# Patient Record
Sex: Female | Born: 1975 | Race: White | Hispanic: No | Marital: Single | Smoking: Never smoker
Health system: Southern US, Community
[De-identification: ages and names within clinical notes are randomized; demographics above are authoritative.]

## PROBLEM LIST (undated history)

## (undated) DIAGNOSIS — F419 Anxiety disorder, unspecified: Secondary | ICD-10-CM

## (undated) DIAGNOSIS — I82409 Acute embolism and thrombosis of unspecified deep veins of unspecified lower extremity: Secondary | ICD-10-CM

## (undated) DIAGNOSIS — I10 Essential (primary) hypertension: Secondary | ICD-10-CM

## (undated) DIAGNOSIS — N390 Urinary tract infection, site not specified: Secondary | ICD-10-CM

## (undated) DIAGNOSIS — F32A Depression, unspecified: Secondary | ICD-10-CM

## (undated) DIAGNOSIS — F329 Major depressive disorder, single episode, unspecified: Secondary | ICD-10-CM

## (undated) HISTORY — PX: FOOT SURGERY: SHX648

## (undated) HISTORY — PX: TUBAL LIGATION: SHX77

## (undated) HISTORY — PX: KIDNEY SURGERY: SHX687

## (undated) HISTORY — PX: TONSILLECTOMY: SUR1361

---

## 2004-08-14 ENCOUNTER — Ambulatory Visit: Payer: Self-pay | Admitting: Family Medicine

## 2004-08-28 ENCOUNTER — Inpatient Hospital Stay: Payer: Self-pay | Admitting: Otolaryngology

## 2004-09-22 ENCOUNTER — Ambulatory Visit: Payer: Self-pay | Admitting: Otolaryngology

## 2004-09-30 ENCOUNTER — Ambulatory Visit: Payer: Self-pay | Admitting: Otolaryngology

## 2004-09-30 ENCOUNTER — Emergency Department: Payer: Self-pay | Admitting: Emergency Medicine

## 2004-10-26 ENCOUNTER — Emergency Department: Payer: Self-pay | Admitting: Emergency Medicine

## 2004-12-11 ENCOUNTER — Ambulatory Visit: Payer: Self-pay | Admitting: Gastroenterology

## 2005-06-25 ENCOUNTER — Emergency Department: Payer: Self-pay | Admitting: Emergency Medicine

## 2005-07-25 ENCOUNTER — Emergency Department: Payer: Self-pay | Admitting: Emergency Medicine

## 2005-08-25 ENCOUNTER — Emergency Department: Payer: Self-pay | Admitting: Emergency Medicine

## 2005-08-29 ENCOUNTER — Emergency Department: Payer: Self-pay | Admitting: Emergency Medicine

## 2005-11-12 ENCOUNTER — Emergency Department: Payer: Self-pay | Admitting: General Practice

## 2005-11-12 ENCOUNTER — Ambulatory Visit: Payer: Self-pay | Admitting: Family Medicine

## 2005-12-05 ENCOUNTER — Emergency Department: Payer: Self-pay | Admitting: Emergency Medicine

## 2006-03-18 ENCOUNTER — Emergency Department: Payer: Self-pay | Admitting: Emergency Medicine

## 2006-04-16 ENCOUNTER — Emergency Department: Payer: Self-pay | Admitting: Emergency Medicine

## 2006-10-27 ENCOUNTER — Emergency Department (HOSPITAL_COMMUNITY): Admission: EM | Admit: 2006-10-27 | Discharge: 2006-10-27 | Payer: Self-pay | Admitting: Emergency Medicine

## 2007-07-30 ENCOUNTER — Emergency Department: Payer: Self-pay | Admitting: Emergency Medicine

## 2007-10-17 ENCOUNTER — Other Ambulatory Visit: Payer: Self-pay

## 2007-10-17 ENCOUNTER — Emergency Department: Payer: Self-pay

## 2008-01-06 ENCOUNTER — Ambulatory Visit: Payer: Self-pay | Admitting: Family Medicine

## 2008-01-08 ENCOUNTER — Ambulatory Visit: Payer: Self-pay | Admitting: Family Medicine

## 2008-03-09 ENCOUNTER — Emergency Department: Payer: Self-pay | Admitting: Emergency Medicine

## 2008-04-24 ENCOUNTER — Ambulatory Visit: Payer: Self-pay | Admitting: Family Medicine

## 2008-07-10 ENCOUNTER — Ambulatory Visit: Payer: Self-pay | Admitting: Family Medicine

## 2008-09-15 ENCOUNTER — Ambulatory Visit: Payer: Self-pay | Admitting: Family Medicine

## 2008-09-22 ENCOUNTER — Emergency Department: Payer: Self-pay | Admitting: Emergency Medicine

## 2008-11-06 ENCOUNTER — Ambulatory Visit: Payer: Self-pay | Admitting: Urology

## 2008-11-19 ENCOUNTER — Emergency Department: Payer: Self-pay | Admitting: Emergency Medicine

## 2008-12-02 ENCOUNTER — Ambulatory Visit: Payer: Self-pay | Admitting: Urology

## 2009-01-14 ENCOUNTER — Ambulatory Visit: Payer: Self-pay | Admitting: Family Medicine

## 2009-06-13 ENCOUNTER — Inpatient Hospital Stay: Payer: Self-pay | Admitting: Specialist

## 2009-06-29 ENCOUNTER — Emergency Department: Payer: Self-pay | Admitting: Emergency Medicine

## 2010-09-18 ENCOUNTER — Emergency Department: Payer: Self-pay | Admitting: Unknown Physician Specialty

## 2011-01-12 DIAGNOSIS — IMO0002 Reserved for concepts with insufficient information to code with codable children: Secondary | ICD-10-CM | POA: Insufficient documentation

## 2012-08-08 DIAGNOSIS — Q8501 Neurofibromatosis, type 1: Secondary | ICD-10-CM | POA: Insufficient documentation

## 2012-08-25 ENCOUNTER — Ambulatory Visit: Payer: Self-pay

## 2012-09-08 ENCOUNTER — Ambulatory Visit: Payer: Self-pay | Admitting: Family

## 2012-10-19 DIAGNOSIS — I10 Essential (primary) hypertension: Secondary | ICD-10-CM | POA: Insufficient documentation

## 2012-11-17 ENCOUNTER — Ambulatory Visit: Payer: Self-pay | Admitting: Family Medicine

## 2012-12-26 DIAGNOSIS — G4733 Obstructive sleep apnea (adult) (pediatric): Secondary | ICD-10-CM | POA: Insufficient documentation

## 2013-04-06 ENCOUNTER — Emergency Department: Payer: Self-pay | Admitting: Emergency Medicine

## 2013-04-06 LAB — CBC
MCV: 81 fL (ref 80–100)
RDW: 15.2 % — ABNORMAL HIGH (ref 11.5–14.5)

## 2013-04-06 LAB — URINALYSIS, COMPLETE
Glucose,UR: NEGATIVE mg/dL (ref 0–75)
Ketone: NEGATIVE
Ph: 8 (ref 4.5–8.0)
Squamous Epithelial: 2
WBC UR: 1 /HPF (ref 0–5)

## 2013-04-06 LAB — BASIC METABOLIC PANEL
Anion Gap: 4 — ABNORMAL LOW (ref 7–16)
BUN: 11 mg/dL (ref 7–18)
Chloride: 103 mmol/L (ref 98–107)
EGFR (African American): >60
EGFR (Non-African Amer.): >60
Glucose: 108 mg/dL — ABNORMAL HIGH (ref 65–99)
Potassium: 3.2 mmol/L — ABNORMAL LOW (ref 3.5–5.1)

## 2013-05-08 DIAGNOSIS — K921 Melena: Secondary | ICD-10-CM | POA: Insufficient documentation

## 2013-09-25 DIAGNOSIS — N946 Dysmenorrhea, unspecified: Secondary | ICD-10-CM | POA: Insufficient documentation

## 2013-09-25 DIAGNOSIS — K5900 Constipation, unspecified: Secondary | ICD-10-CM | POA: Insufficient documentation

## 2013-09-25 DIAGNOSIS — N920 Excessive and frequent menstruation with regular cycle: Secondary | ICD-10-CM | POA: Insufficient documentation

## 2013-09-25 DIAGNOSIS — D649 Anemia, unspecified: Secondary | ICD-10-CM | POA: Insufficient documentation

## 2013-11-12 DIAGNOSIS — M79601 Pain in right arm: Secondary | ICD-10-CM | POA: Insufficient documentation

## 2014-05-19 ENCOUNTER — Emergency Department: Payer: Self-pay | Admitting: Emergency Medicine

## 2015-04-12 ENCOUNTER — Emergency Department
Admission: EM | Admit: 2015-04-12 | Discharge: 2015-04-12 | Disposition: A | Discharge status: Home / Self Care | Payer: Self-pay | Attending: Emergency Medicine | Admitting: Emergency Medicine

## 2015-04-12 ENCOUNTER — Emergency Department: Payer: Self-pay

## 2015-04-12 DIAGNOSIS — J029 Acute pharyngitis, unspecified: Secondary | ICD-10-CM | POA: Insufficient documentation

## 2015-04-12 DIAGNOSIS — R319 Hematuria, unspecified: Secondary | ICD-10-CM

## 2015-04-12 DIAGNOSIS — R5383 Other fatigue: Secondary | ICD-10-CM | POA: Insufficient documentation

## 2015-04-12 DIAGNOSIS — R197 Diarrhea, unspecified: Secondary | ICD-10-CM

## 2015-04-12 DIAGNOSIS — G35 Multiple sclerosis: Secondary | ICD-10-CM | POA: Insufficient documentation

## 2015-04-12 DIAGNOSIS — N390 Urinary tract infection, site not specified: Secondary | ICD-10-CM

## 2015-04-12 DIAGNOSIS — R1110 Vomiting, unspecified: Secondary | ICD-10-CM

## 2015-04-12 HISTORY — DX: Essential (primary) hypertension: I10

## 2015-04-12 HISTORY — DX: Urinary tract infection, site not specified: N39.0

## 2015-04-12 LAB — COMPREHENSIVE METABOLIC PANEL
ALBUMIN: 4.2 g/dL (ref 3.5–5.0)
ALT: 14 U/L (ref 14–54)
AST: 21 U/L (ref 15–41)
Alkaline Phosphatase: 78 U/L (ref 38–126)
Anion gap: 7 (ref 5–15)
BILIRUBIN TOTAL: 0.9 mg/dL (ref 0.3–1.2)
BUN: 20 mg/dL (ref 6–20)
CALCIUM: 8.6 mg/dL — AB (ref 8.9–10.3)
CO2: 26 mmol/L (ref 22–32)
Chloride: 107 mmol/L (ref 101–111)
Creatinine, Ser: 0.58 mg/dL (ref 0.44–1.00)
GFR calc Af Amer: >60 mL/min (ref >60)
GLUCOSE: 88 mg/dL (ref 65–99)
Potassium: 3.9 mmol/L (ref 3.5–5.1)
SODIUM: 140 mmol/L (ref 135–145)
Total Protein: 7.5 g/dL (ref 6.5–8.1)

## 2015-04-12 LAB — CBC WITH DIFFERENTIAL/PLATELET
Basophils Absolute: 0 10*3/uL (ref 0–0.1)
Basophils Relative: 1 %
EOS ABS: 0.1 10*3/uL (ref 0–0.7)
EOS PCT: 2 %
HEMATOCRIT: 45.4 % (ref 35.0–47.0)
HEMOGLOBIN: 15.2 g/dL (ref 12.0–16.0)
LYMPHS ABS: 1.5 10*3/uL (ref 1.0–3.6)
LYMPHS PCT: 25 %
MCH: 28.6 pg (ref 26.0–34.0)
MCHC: 33.5 g/dL (ref 32.0–36.0)
MCV: 85.6 fL (ref 80.0–100.0)
MONOS PCT: 11 %
Monocytes Absolute: 0.7 10*3/uL (ref 0.2–0.9)
NEUTROS ABS: 3.8 10*3/uL (ref 1.4–6.5)
Neutrophils Relative %: 61 %
PLATELETS: 184 10*3/uL (ref 150–440)
RBC: 5.31 MIL/uL — AB (ref 3.80–5.20)
RDW: 14 % (ref 11.5–14.5)
WBC: 6.1 10*3/uL (ref 3.6–11.0)

## 2015-04-12 LAB — URINALYSIS COMPLETE WITH MICROSCOPIC (ARMC ONLY)
BILIRUBIN URINE: NEGATIVE
GLUCOSE, UA: NEGATIVE mg/dL
KETONES UR: NEGATIVE mg/dL
LEUKOCYTES UA: NEGATIVE
NITRITE: NEGATIVE
Protein, ur: 100 mg/dL — AB
SPECIFIC GRAVITY, URINE: 1.023 (ref 1.005–1.030)
pH: 5 (ref 5.0–8.0)

## 2015-04-12 LAB — LIPASE, BLOOD: Lipase: 20 U/L — ABNORMAL LOW (ref 22–51)

## 2015-04-12 MED ORDER — DIPHENHYDRAMINE HCL 50 MG/ML IJ SOLN
INTRAMUSCULAR | Status: AC
Start: 2015-04-12 — End: 2015-04-12
  Administered 2015-04-12: 25 mg via INTRAVENOUS
  Filled 2015-04-12: qty 1

## 2015-04-12 MED ORDER — MORPHINE SULFATE 4 MG/ML IJ SOLN
4.0000 mg | Freq: Once | INTRAMUSCULAR | Status: AC
  Administered 2015-04-12: 4 mg via INTRAVENOUS

## 2015-04-12 MED ORDER — DIPHENHYDRAMINE HCL 50 MG/ML IJ SOLN
25.0000 mg | Freq: Once | INTRAMUSCULAR | Status: AC
  Administered 2015-04-12: 25 mg via INTRAVENOUS

## 2015-04-12 MED ORDER — CEFPODOXIME PROXETIL 200 MG PO TABS: 200.0000 mg | ORAL_TABLET | Freq: Two times a day (BID) | ORAL | Status: DC

## 2015-04-12 MED ORDER — ONDANSETRON HCL 4 MG/2ML IJ SOLN
INTRAMUSCULAR | Status: AC
  Administered 2015-04-12: 4 mg via INTRAVENOUS
  Filled 2015-04-12: qty 2

## 2015-04-12 MED ORDER — ONDANSETRON HCL 4 MG/2ML IJ SOLN
4.0000 mg | Freq: Once | INTRAMUSCULAR | Status: AC
  Administered 2015-04-12: 4 mg via INTRAVENOUS

## 2015-04-12 MED ORDER — SODIUM CHLORIDE 0.9 % IV BOLUS (SEPSIS)
1000.0000 mL | Freq: Once | INTRAVENOUS | Status: AC
  Administered 2015-04-12: 1000 mL via INTRAVENOUS

## 2015-04-12 MED ORDER — IOHEXOL 300 MG/ML  SOLN
100.0000 mL | Freq: Once | INTRAMUSCULAR | Status: AC | PRN
  Administered 2015-04-12: 100 mL via INTRAVENOUS

## 2015-04-12 MED ORDER — HYDROCODONE-ACETAMINOPHEN 5-325 MG PO TABS: 1.0000 | ORAL_TABLET | Freq: Four times a day (QID) | ORAL | Status: DC | PRN

## 2015-04-12 MED ORDER — MORPHINE SULFATE 4 MG/ML IJ SOLN
INTRAMUSCULAR | Status: AC
  Administered 2015-04-12: 4 mg via INTRAVENOUS
  Filled 2015-04-12: qty 1

## 2015-04-12 MED ORDER — ONDANSETRON HCL 4 MG PO TABS: 4.0000 mg | ORAL_TABLET | Freq: Three times a day (TID) | ORAL | Status: DC | PRN

## 2015-04-12 MED ORDER — BARIUM SULFATE 2.1 % PO SUSP
450.0000 mL | ORAL | Status: AC
  Administered 2015-04-12: 450 mL via ORAL

## 2015-04-12 NOTE — ED Notes (Signed)
D/c instructions reviewed w/ pt - pt denies any further questions or concerns at present.  Pt instructed to not use alcohol, drive, or operate heavy machinery while take the prescription pain medications as they could make him drowsy - pt verbalized understanding.

## 2015-04-12 NOTE — ED Provider Notes (Signed)
[facility] Emergency Department Provider Note   ____________________________________________  Time seen: 10:45 AM I have reviewed the triage vital signs and the triage nursing note.  HISTORY  Chief Complaint Abdominal Pain; Emesis; and Diarrhea   Historian Patient  HPI Tiffany Andrade is a 39 y.o. female who is complaining of upper abdominal pain on the left side, along with vomiting and diarrhea for about 4 days. Abdominal pain is crampy. No diarrhea today. No black or bloody stools. She has a history of numerous urinary tract infections in the past.No specific dysuria today. She has a history of sulfa allergy which is described as "sulfa doesn't work ". Her penicillin allergy is hives. Her allergy to IVP dye was felt nauseated and "turned white "when she first had a contrasted CT scan, however since then she's had several contrasted CT scans per the patient where she had no reaction but did do 25 mg Benadryl before the CT scan.    Past Medical History  Diagnosis Date  . Hypertension   . UTI (lower urinary tract infection)     There are no active problems to display for this patient.   Past Surgical History  Procedure Laterality Date  . Tonsillectomy      Current Outpatient Rx  Name  Route  Sig  Dispense  Refill  . cefpodoxime (VANTIN) 200 MG tablet   Oral   Take 1 tablet (200 mg total) by mouth 2 (two) times daily.   14 tablet   0   . HYDROcodone-acetaminophen (NORCO/VICODIN) 5-325 MG per tablet   Oral   Take 1 tablet by mouth every 6 (six) hours as needed for moderate pain.   10 tablet   0   . ondansetron (ZOFRAN) 4 MG tablet   Oral   Take 1 tablet (4 mg total) by mouth every 8 (eight) hours as needed for nausea or vomiting.   10 tablet   1     Allergies Codeine; Ivp dye; Penicillins; Sulfa antibiotics; and Tape  No family history on file.  Social History History  Substance Use Topics  . Smoking status: Never Smoker   .  Smokeless tobacco: Not on file  . Alcohol Use: No    Review of Systems  Constitutional: Negative for fever. Eyes: Negative for visual changes. ENT: Negative for sore throat. Cardiovascular: Negative for chest pain. Respiratory: Negative for shortness of breath. Gastrointestinal: Negative for black or bloody stools. Genitourinary: Negative for dysuria. Musculoskeletal: Negative for back pain. Skin: Negative for rash. Neurological: Negative for headaches, focal weakness or numbness.  ____________________________________________   PHYSICAL EXAM:  VITAL SIGNS: ED Triage Vitals  Enc Vitals Group     BP 04/12/15 0923 154/100 mmHg     Pulse Rate 04/12/15 0923 76     Resp 04/12/15 0923 20     Temp 04/12/15 0923 97.9 F (36.6 C)     Temp Source 04/12/15 0923 Oral     SpO2 04/12/15 0923 100 %     Weight 04/12/15 0923 194 lb (87.998 kg)     Height 04/12/15 0923 4\' 11"  (1.499 m)     Head Cir --      Peak Flow --      Pain Score 04/12/15 0923 8     Pain Loc --      Pain Edu? --      Excl. in [place]? --      Constitutional: Alert and oriented. Well appearing and in no distress. Eyes: Conjunctivae are  normal. PERRL. Normal extraocular movements. ENT   Head: Normocephalic and atraumatic.   Nose: No congestion/rhinnorhea.   Mouth/Throat: Mucous membranes are moist.   Neck: No stridor. Cardiovascular: Normal rate, regular rhythm.  No murmurs, rubs, or gallops. Respiratory: Normal respiratory effort without tachypnea nor retractions. Breath sounds are clear and equal bilaterally. No wheezes/rales/rhonchi. Gastrointestinal: Soft. No distention, no guarding, no rebound. Moderate tenderness in the left upper quadrant  Genitourinary/rectal: Deferred Musculoskeletal: Nontender with normal range of motion in all extremities. No joint effusions.  No lower extremity tenderness nor edema. Neurologic:  Normal speech and language. No gross focal neurologic deficits are  appreciated. Skin:  Skin is warm, dry and intact. No rash noted. Psychiatric: Mood and affect are normal. Speech and behavior are normal. Patient exhibits appropriate insight and judgment.  ____________________________________________   EKG  None ____________________________________________  LABS (pertinent positives/negatives)  Lipase 20 Metabolic panel within normal limits White blood cell count normal at 6.1 with a normal hemoglobin 15.2 Urinalysis 3+ hemoglobin, 2 numerous to count red blood cells, 6-30 white blood cells, few bacteria with 0-5 squamous epithelial cells  ____________________________________________  RADIOLOGY Radiologist results reviewed  CT abdomen and pelvis with contrast:  IMPRESSION: Stable extrarenal pelves.  Changes at the ureterovesical junction on the left with mild dilatation of the collecting system which may be postoperative in nature given the patient's clinical history. The overall appearance is stable from the prior exam.  No acute abnormality is noted. __________________________________________  PROCEDURES  Procedure(s) performed: None Critical Care performed: None  ____________________________________________   ED COURSE / ASSESSMENT AND PLAN  Pertinent labs & imaging results that were available during my care of the patient were reviewed by me and considered in my medical decision making (see chart for details).  Patient having vomiting and diarrhea as well as labs showing a urinary tract infection with hematuria. I discussed with her obtaining a CT scan for further evaluation. Her urinalysis shows blood as well as bacteria and white blood cells but without nitrates and leukocytes. I am suspicious of possible urinary tract infection given her history of numerous urinary tract infections in the past. I will give her dose of Rocephin here and outpatient Vantin due to history of allergy sulfa, local resistance patterns of Cipro. Urine  culture was sent. Her symptoms feel different than previous kidney stones. However this is a consideration. I discussed with her contrast versus noncontrast CT knowing that for pyelonephritis contrast would be better and helpful. She states that she had an adverse reaction to contrast dye at one point time but since then has had no problems with contrast CTs and has had Benadryl prior to the CDs. We decided to go ahead with Benadryl and then contrast abdominal CT today.  CT scan is reassuring, patient will be discharged home with enema next, and symptomatic medications. Return depressions and discharge instructions along with follow-up plan discussed with patient. She is comfortable with this plan.   ___________________________________________   FINAL CLINICAL IMPRESSION(S) / ED DIAGNOSES   Final diagnoses:  Vomiting and diarrhea  Urinary tract infection with hematuria, site unspecified      Lisa Roca, MD 04/12/15 1431

## 2015-04-12 NOTE — ED Provider Notes (Signed)
-----------------------------------------   4:18 PM on 04/12/2015 -----------------------------------------  The pharmacist at [facility] called and said that they do not have Cefpodoxime in stock.  I reviewed Dr. Alinda Money note and verbally authorized a prescription for Keflex 500 mg by mouth twice a day 7 days in accordance with the [facility] infectious disease recommendations for uncomplicated UTI given her reassuring labs, stable vitals, and no CT evidence of pyelonephritis.  Hinda Kehr, MD 04/12/15 [phone]

## 2015-04-12 NOTE — Discharge Instructions (Signed)
Diarrhea Diarrhea is watery poop (stool). It can make you feel weak, tired, thirsty, or give you a dry mouth (signs of dehydration). Watery poop is a sign of another problem, most often an infection. It often lasts 2-3 days. It can last longer if it is a sign of something serious. Take care of yourself as told by your doctor. HOME CARE   Drink 1 cup (8 ounces) of fluid each time you have watery poop.  Do not drink the following fluids:  Those that contain simple sugars (fructose, glucose, galactose, lactose, sucrose, maltose).  Sports drinks.  Fruit juices.  Whole milk products.  Sodas.  Drinks with caffeine (coffee, tea, soda) or alcohol.  Oral rehydration solution may be used if the doctor says it is okay. You may make your own solution. Follow this recipe:   - teaspoon table salt.   teaspoon baking soda.   teaspoon salt substitute containing potassium chloride.  1 tablespoons sugar.  1 liter (34 ounces) of water.  Avoid the following foods:  High fiber foods, such as raw fruits and vegetables.  Nuts, seeds, and whole grain breads and cereals.   Those that are sweetened with sugar alcohols (xylitol, sorbitol, mannitol).  Try eating the following foods:  Starchy foods, such as rice, toast, pasta, low-sugar cereal, oatmeal, baked potatoes, crackers, and bagels.  Bananas.  Applesauce.  Eat probiotic-rich foods, such as yogurt and milk products that are fermented.  Wash your hands well after each time you have watery poop.  Only take medicine as told by your doctor.  Take a warm bath to help lessen burning or pain from having watery poop. GET HELP RIGHT AWAY IF:   You cannot drink fluids without throwing up (vomiting).  You keep throwing up.  You have blood in your poop, or your poop looks black and tarry.  You do not pee (urinate) in 6-8 hours, or there is only a small amount of very dark pee.  You have belly (abdominal) pain that gets worse or stays  in the same spot (localizes).  You are weak, dizzy, confused, or light-headed.  You have a very bad headache.  Your watery poop gets worse or does not get better.  You have a fever or lasting symptoms for more than 2-3 days.  You have a fever and your symptoms suddenly get worse. MAKE SURE YOU:   Understand these instructions.  Will watch your condition.  Will get help right away if you are not doing well or get worse. Document Released: 04/05/2008 Document Revised: 03/04/2014 Document Reviewed: 06/25/2012 [facility] Patient Information 2015 [place], [place]. This information is not intended to replace advice given to you by your health care provider. Make sure you discuss any questions you have with your health care provider.  Nausea and Vomiting Nausea means you feel sick to your stomach. Throwing up (vomiting) is a reflex where stomach contents come out of your mouth. HOME CARE   Take medicine as told by your doctor.  Do not force yourself to eat. However, you do need to drink fluids.  If you feel like eating, eat a normal diet as told by your doctor.  Eat rice, wheat, potatoes, bread, lean meats, yogurt, fruits, and vegetables.  Avoid high-fat foods.  Drink enough fluids to keep your pee (urine) clear or pale yellow.  Ask your doctor how to replace body fluid losses (rehydrate). Signs of body fluid loss (dehydration) include:  Feeling very thirsty.  Dry lips and mouth.  Feeling dizzy.  Dark pee.  Peeing less than normal.  Feeling confused.  Fast breathing or heart rate. GET HELP RIGHT AWAY IF:   You have blood in your throw up.  You have black or bloody poop (stool).  You have a bad headache or stiff neck.  You feel confused.  You have bad belly (abdominal) pain.  You have chest pain or trouble breathing.  You do not pee at least once every 8 hours.  You have cold, clammy skin.  You keep throwing up after 24 to 48 hours.  You have a  fever. MAKE SURE YOU:   Understand these instructions.  Will watch your condition.  Will get help right away if you are not doing well or get worse. Document Released: 04/05/2008 Document Revised: 01/10/2012 Document Reviewed: 03/19/2011 [facility] Patient Information 2015 [place], [place]. This information is not intended to replace advice given to you by your health care provider. Make sure you discuss any questions you have with your health care provider.  Urinary Tract Infection A urinary tract infection (UTI) can occur any place along the urinary tract. The tract includes the kidneys, ureters, bladder, and urethra. A type of germ called bacteria often causes a UTI. UTIs are often helped with antibiotic medicine.  HOME CARE   If given, take antibiotics as told by your doctor. Finish them even if you start to feel better.  Drink enough fluids to keep your pee (urine) clear or pale yellow.  Avoid tea, drinks with caffeine, and bubbly (carbonated) drinks.  Pee often. Avoid holding your pee in for a long time.  Pee before and after having sex (intercourse).  Wipe from front to back after you poop (bowel movement) if you are a woman. Use each tissue only once. GET HELP RIGHT AWAY IF:   You have back pain.  You have lower belly (abdominal) pain.  You have chills.  You feel sick to your stomach (nauseous).  You throw up (vomit).  Your burning or discomfort with peeing does not go away.  You have a fever.  Your symptoms are not better in 3 days. MAKE SURE YOU:   Understand these instructions.  Will watch your condition.  Will get help right away if you are not doing well or get worse. Document Released: 04/05/2008 Document Revised: 07/12/2012 Document Reviewed: 05/18/2012 [facility] Patient Information 2015 [place], [place]. This information is not intended to replace advice given to you by your health care provider. Make sure you discuss any questions you have with your  health care provider.

## 2015-04-12 NOTE — ED Notes (Signed)
Abdominal cramping X 4 days, vomiting and diarrhea after eating. Vomit X 2 today, denies diarrhea. Pt alert and oriented X4, active, cooperative, pt in NAD. RR even and unlabored, color WNL.

## 2015-04-13 LAB — URINE CULTURE

## 2015-08-30 ENCOUNTER — Emergency Department
Admission: EM | Admit: 2015-08-30 | Discharge: 2015-08-30 | Disposition: A | Discharge status: Home / Self Care | Payer: Self-pay | Attending: Emergency Medicine | Admitting: Emergency Medicine

## 2015-08-30 ENCOUNTER — Encounter: Payer: Self-pay | Admitting: Emergency Medicine

## 2015-08-30 ENCOUNTER — Emergency Department: Payer: Self-pay

## 2015-08-30 DIAGNOSIS — I10 Essential (primary) hypertension: Secondary | ICD-10-CM | POA: Insufficient documentation

## 2015-08-30 DIAGNOSIS — Z792 Long term (current) use of antibiotics: Secondary | ICD-10-CM | POA: Insufficient documentation

## 2015-08-30 DIAGNOSIS — M79604 Pain in right leg: Secondary | ICD-10-CM | POA: Insufficient documentation

## 2015-08-30 DIAGNOSIS — Z880 Allergy status to penicillin: Secondary | ICD-10-CM | POA: Insufficient documentation

## 2015-08-30 DIAGNOSIS — Z79899 Other long term (current) drug therapy: Secondary | ICD-10-CM | POA: Insufficient documentation

## 2015-08-30 DIAGNOSIS — F419 Anxiety disorder, unspecified: Secondary | ICD-10-CM | POA: Insufficient documentation

## 2015-08-30 HISTORY — DX: Acute embolism and thrombosis of unspecified deep veins of unspecified lower extremity: I82.409

## 2015-08-30 LAB — APTT: APTT: 30 s (ref 24–36)

## 2015-08-30 LAB — BASIC METABOLIC PANEL
ANION GAP: 8 (ref 5–15)
BUN: 17 mg/dL (ref 6–20)
CALCIUM: 9.2 mg/dL (ref 8.9–10.3)
CHLORIDE: 106 mmol/L (ref 101–111)
CO2: 24 mmol/L (ref 22–32)
Creatinine, Ser: 0.71 mg/dL (ref 0.44–1.00)
GFR calc non Af Amer: >60 mL/min (ref >60)
Glucose, Bld: 117 mg/dL — ABNORMAL HIGH (ref 65–99)
Potassium: 3.1 mmol/L — ABNORMAL LOW (ref 3.5–5.1)
SODIUM: 138 mmol/L (ref 135–145)

## 2015-08-30 LAB — CBC
HCT: 45.6 % (ref 35.0–47.0)
HEMOGLOBIN: 15.2 g/dL (ref 12.0–16.0)
MCH: 27.7 pg (ref 26.0–34.0)
MCHC: 33.4 g/dL (ref 32.0–36.0)
MCV: 83 fL (ref 80.0–100.0)
Platelets: 222 10*3/uL (ref 150–440)
RBC: 5.5 MIL/uL — AB (ref 3.80–5.20)
RDW: 14.4 % (ref 11.5–14.5)
WBC: 10.1 10*3/uL (ref 3.6–11.0)

## 2015-08-30 LAB — FIBRIN DERIVATIVES D-DIMER (ARMC ONLY): FIBRIN DERIVATIVES D-DIMER (ARMC): 281 (ref 0–499)

## 2015-08-30 LAB — PROTIME-INR
INR: 0.94
Prothrombin Time: 12.8 seconds (ref 11.4–15.0)

## 2015-08-30 NOTE — ED Notes (Signed)
MD at bedside. 
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
## 2015-08-30 NOTE — ED Notes (Signed)
Pt in ultrasound. Family at bedside.
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
## 2015-08-30 NOTE — Discharge Instructions (Signed)

## 2015-10-10 ENCOUNTER — Ambulatory Visit
Admission: EM | Admit: 2015-10-10 | Discharge: 2015-10-10 | Disposition: A | Discharge status: Home / Self Care | Payer: Self-pay | Attending: Family Medicine | Admitting: Family Medicine

## 2015-10-10 ENCOUNTER — Ambulatory Visit (INDEPENDENT_AMBULATORY_CARE_PROVIDER_SITE_OTHER): Payer: Self-pay

## 2015-10-10 DIAGNOSIS — M79671 Pain in right foot: Secondary | ICD-10-CM

## 2015-10-10 DIAGNOSIS — M21611 Bunion of right foot: Secondary | ICD-10-CM

## 2015-10-10 MED ORDER — MELOXICAM 15 MG PO TABS: 15.0000 mg | ORAL_TABLET | Freq: Every day | ORAL | Status: DC

## 2015-10-10 MED ORDER — KETOROLAC TROMETHAMINE 60 MG/2ML IM SOLN
60.0000 mg | Freq: Once | INTRAMUSCULAR | Status: AC
  Administered 2015-10-10: 60 mg via INTRAMUSCULAR

## 2015-10-10 NOTE — ED Provider Notes (Signed)
CSN: [id number]     Arrival date & time 10/10/15  1644 History   First MD Initiated Contact with Patient 10/10/15 1843   Nurse's notes reviewed    Patient's having pain in the right big toe. The pain actually is in the right great toe and the first MP joint. She has a history of trauma to the foot which she states was about 2 or 3 months ago later memory was improved and she does admit there was over a year ago that she had x-rays of the foot at [facility]. 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 His new job forgiveness for absenteeism since she is trying to keep going to maintain her job. She states that the foot becomes cold time but never has any trouble with heat or warm foot.    Chief Complaint  Patient presents with  . Toe Pain   (Consider location/radiation/quality/duration/timing/severity/associated sxs/prior Treatment) Patient is a 39 y.o. female presenting with toe pain. The history is provided by the patient. No language interpreter was used.  Toe Pain This is a recurrent problem. The problem occurs constantly. The problem has been gradually worsening. Pertinent negatives include no chest pain, no abdominal pain, no headaches and no shortness of breath. The symptoms are aggravated by walking. Nothing relieves the symptoms. She has tried nothing for the symptoms.    Past Medical History  Diagnosis Date  . Hypertension   . UTI (lower urinary tract infection)   . DVT (deep venous thrombosis) [facility])    Past Surgical History  Procedure Laterality Date  . Tonsillectomy    . Cesarean section      x2  . Tubal ligation     History reviewed. No pertinent family history. Social History  Substance Use Topics  . Smoking status: Never Smoker   . Smokeless tobacco: None  . Alcohol Use: No   OB History    No data available     Review of Systems  Respiratory:  Negative for shortness of breath.   Cardiovascular: Negative for chest pain.  Gastrointestinal: Negative for abdominal pain.  Musculoskeletal: Positive for myalgias, joint swelling, arthralgias and gait problem.  Skin: Negative.   Neurological: Negative for headaches.  All other systems reviewed and are negative.   Allergies  Codeine; Ivp dye; Penicillins; Sulfa antibiotics; and Tape  Home Medications   Prior to Admission medications   Medication Sig Start Date End Date Taking? Authorizing Provider  amLODipine (NORVASC) 5 MG tablet Take 5 mg by mouth daily.   Yes Historical Provider, MD  FLUoxetine (PROZAC) 20 MG capsule Take 60 mg by mouth daily.   Yes Historical Provider, MD  ibuprofen (ADVIL,MOTRIN) 200 MG tablet Take 800 mg by mouth every 8 (eight) hours as needed for headache, mild pain or moderate pain.   Yes Historical Provider, MD  levonorgestrel (MIRENA) 20 MCG/24HR IUD 20 each by Intrauterine route once. 09/25/13  Yes Historical Provider, MD  lithium carbonate (ESKALITH) 450 MG CR tablet Take 450 mg by mouth 3 (three) times daily.   Yes Historical Provider, MD  losartan (COZAAR) 25 MG tablet Take 25 mg by mouth daily.   Yes Historical Provider, MD  traZODone (DESYREL) 100 MG tablet Take 100 mg by mouth at bedtime as needed. For sleep.   Yes Historical Provider, MD  cefpodoxime (VANTIN) 200 MG tablet Take 1  tablet (200 mg total) by mouth 2 (two) times daily. 04/12/15   Lisa Roca, MD  HYDROcodone-acetaminophen (NORCO/VICODIN) 5-325 MG per tablet Take 1 tablet by mouth every 6 (six) hours as needed for moderate pain. 04/12/15   Lisa Roca, MD  lisinopril-hydrochlorothiazide (PRINZIDE,ZESTORETIC) 20-25 MG tablet Take 1 tablet by mouth daily.    Historical Provider, MD  meloxicam (MOBIC) 15 MG tablet Take 1 tablet (15 mg total) by mouth daily. 10/10/15   Frederich Cha, MD  ondansetron (ZOFRAN) 4 MG tablet Take 1 tablet (4 mg total) by mouth every 8 (eight) hours as needed for nausea  or vomiting. 04/12/15   Lisa Roca, MD   Meds Ordered and Administered this Visit   Medications  ketorolac (TORADOL) injection 60 mg (60 mg Intramuscular Given 10/10/15 1926)    BP 149/93 mmHg  Pulse 77  Temp(Src) 98 F (36.7 C) (Tympanic)  Resp 16  Ht 4\' 11"  (1.499 m)  Wt 193 lb (87.544 kg)  BMI 38.96 kg/m2  SpO2 100%  LMP 09/19/2015 No data found.   Physical Exam  Constitutional: She is oriented to person, place, and time. She appears well-developed and well-nourished.  HENT:  Head: Normocephalic and atraumatic.  Eyes: Conjunctivae are normal. Pupils are equal, round, and reactive to light.  Neck: Normal range of motion.  Musculoskeletal: She exhibits edema and tenderness.       Right foot: There is tenderness, bony tenderness and swelling.       Feet:  Tenderness is over the first metatarsal phalangeal joint but unlike the finding of gout is notable warmth or present. Back the joint is actually coded touch but very painful to touch and the slight deformity of the first distal metatarsal bone consistent with a bunion formation. 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  Neurological: She is alert and oriented to person, place, and time. No cranial nerve deficit.  Skin: Skin is warm. No rash noted. No erythema.  Vitals reviewed.   ED Course  Procedures (including critical care time)  Labs Review Labs Reviewed - No data to display  Imaging Review Dg Foot Complete Right  10/10/2015  CLINICAL DATA:  Right first metatarsal-phalangeal joint pain for 1 week EXAM: RIGHT FOOT COMPLETE - 3+ VIEW COMPARISON:  None. FINDINGS: Three views of the right foot submitted. No acute fracture or subluxation. Mild hallux valgus deformity. Mild degenerative changes first metatarsal phalangeal joint. Small plantar and posterior spur of calcaneus. IMPRESSION: No acute fracture or subluxation. Mild degenerative  changes first metatarsal phalangeal joint. Small plantar and posterior spur of calcaneus. Electronically Signed   By: Lahoma Crocker M.D.   On: 10/10/2015 20:08     Visual Acuity Review  Right Eye Distance:   Left Eye Distance:   Bilateral Distance:    Right Eye Near:   Left Eye Near:    Bilateral Near:         MDM   1. Acute foot pain, right   2. Bunion, right foot      By clinical presentation examination patient has a bunion  Recommend follow-up Dr. Vickki Muff podiatrist to clinic we'll place on Mobic 15 mg 1 tablet day. She did state that Toradol 60 mg injection IM did help with the pain.    Frederich Cha, MD 10/10/15 [phone]

## 2015-10-10 NOTE — Discharge Instructions (Signed)
Cryotherapy Cryotherapy is when you put ice on your injury. Ice helps lessen pain and puffiness (swelling) after an injury. Ice works the best when you start using it in the first 24 to 48 hours after an injury. HOME CARE  Put a dry or damp towel between the ice pack and your skin.  You may press gently on the ice pack.  Leave the ice on for no more than 10 to 20 minutes at a time.  Check your skin after 5 minutes to make sure your skin is okay.  Rest at least 20 minutes between ice pack uses.  Stop using ice when your skin loses feeling (numbness).  Do not use ice on someone who cannot tell you when it hurts. This includes small children and people with memory problems (dementia). GET HELP RIGHT AWAY IF:  You have white spots on your skin.  Your skin turns blue or pale.  Your skin feels waxy or hard.  Your puffiness gets worse. MAKE SURE YOU:   Understand these instructions.  Will watch your condition.  Will get help right away if you are not doing well or get worse.   This information is not intended to replace advice given to you by your health care provider. Make sure you discuss any questions you have with your health care provider.   Document Released: 04/05/2008 Document Revised: 01/10/2012 Document Reviewed: 06/10/2011 Elsevier Interactive Patient Education 2016 Elsevier [employer]  Musculoskeletal Pain Musculoskeletal pain is muscle and boney aches and pains. These pains can occur in any part of the body. Your caregiver may treat you without knowing the cause of the pain. They may treat you if blood or urine tests, X-rays, and other tests were normal.  CAUSES There is often not a definite cause or reason for these pains. These pains may be caused by a type of germ (virus). The discomfort may also come from overuse. Overuse includes working out too hard when your body is not fit. Boney aches also come from weather changes. Bone is sensitive to atmospheric pressure  changes. HOME CARE INSTRUCTIONS   Ask when your test results will be ready. Make sure you get your test results.  Only take over-the-counter or prescription medicines for pain, discomfort, or fever as directed by your caregiver. If you were given medications for your condition, do not drive, operate machinery or power tools, or sign legal documents for 24 hours. Do not drink alcohol. Do not take sleeping pills or other medications that may interfere with treatment.  Continue all activities unless the activities cause more pain. When the pain lessens, slowly resume normal activities. Gradually increase the intensity and duration of the activities or exercise.  During periods of severe pain, bed rest may be helpful. Lay or sit in any position that is comfortable.  Putting ice on the injured area.  Put ice in a bag.  Place a towel between your skin and the bag.  Leave the ice on for 15 to 20 minutes, 3 to 4 times a day.  Follow up with your caregiver for continued problems and no reason can be found for the pain. If the pain becomes worse or does not go away, it may be necessary to repeat tests or do additional testing. Your caregiver may need to look further for a possible cause. SEEK IMMEDIATE MEDICAL CARE IF:  You have pain that is getting worse and is not relieved by medications.  You develop chest pain that is associated with shortness or  breath, sweating, feeling sick to your stomach (nauseous), or throw up (vomit).  Your pain becomes localized to the abdomen.  You develop any new symptoms that seem different or that concern you. MAKE SURE YOU:   Understand these instructions.  Will watch your condition.  Will get help right away if you are not doing well or get worse.   This information is not intended to replace advice given to you by your health care provider. Make sure you discuss any questions you have with your health care provider.   Document Released: 10/18/2005  Document Revised: 01/10/2012 Document Reviewed: 06/22/2013 Elsevier Interactive Patient Education [employer].

## 2015-10-10 NOTE — ED Notes (Signed)
Patient states that she has right big toe pain. She dropped a can of green beans on it about 2 months ago. She states that toe was painful then and she states that she went to the doctor and they did an x-ray and reports that it was just bruised. She states that pain returned about 1 week ago. Patient states that when she puts pressure on her toe, she has a great amount of pain. She states that standing on her tip toes is extremely painful and she states that she recently started a new job and is required to wear Steel toe shoes and isn't sure if this is what caused it to be painful again.

## 2016-01-16 ENCOUNTER — Encounter: Payer: Self-pay | Admitting: *Deleted

## 2016-01-16 ENCOUNTER — Ambulatory Visit
Admission: EM | Admit: 2016-01-16 | Discharge: 2016-01-16 | Disposition: A | Discharge status: Home / Self Care | Payer: BLUE CROSS/BLUE SHIELD | Attending: Family Medicine | Admitting: Family Medicine

## 2016-01-16 DIAGNOSIS — R110 Nausea: Secondary | ICD-10-CM | POA: Diagnosis not present

## 2016-01-16 DIAGNOSIS — J0110 Acute frontal sinusitis, unspecified: Secondary | ICD-10-CM | POA: Diagnosis not present

## 2016-01-16 LAB — RAPID STREP SCREEN (MED CTR MEBANE ONLY): STREPTOCOCCUS, GROUP A SCREEN (DIRECT): NEGATIVE

## 2016-01-16 MED ORDER — DOXYCYCLINE HYCLATE 100 MG PO CAPS
100.0000 mg | ORAL_CAPSULE | Freq: Two times a day (BID) | ORAL | Status: DC
Start: 2016-01-16 — End: 2016-08-12

## 2016-01-16 MED ORDER — ONDANSETRON 4 MG PO TBDP: 4.0000 mg | ORAL_TABLET | Freq: Three times a day (TID) | ORAL | Status: DC | PRN

## 2016-01-16 NOTE — Discharge Instructions (Signed)
Take medication as prescribed. Rest. Drink plenty of fluids.   Follow up with your primary care physician this week as needed. Return to Urgent care for new or worsening concerns.    Sinusitis, Adult Sinusitis is redness, soreness, and inflammation of the paranasal sinuses. Paranasal sinuses are air pockets within the bones of your face. They are located beneath your eyes, in the middle of your forehead, and above your eyes. In healthy paranasal sinuses, mucus is able to drain out, and air is able to circulate through them by way of your nose. However, when your paranasal sinuses are inflamed, mucus and air can become trapped. This can allow bacteria and other germs to grow and cause infection. Sinusitis can develop quickly and last only a short time (acute) or continue over a long period (chronic). Sinusitis that lasts for more than 12 weeks is considered chronic. CAUSES Causes of sinusitis include:  Allergies.  Structural abnormalities, such as displacement of the cartilage that separates your nostrils (deviated septum), which can decrease the air flow through your nose and sinuses and affect sinus drainage.  Functional abnormalities, such as when the small hairs (cilia) that line your sinuses and help remove mucus do not work properly or are not present. SIGNS AND SYMPTOMS Symptoms of acute and chronic sinusitis are the same. The primary symptoms are pain and pressure around the affected sinuses. Other symptoms include:  Upper toothache.  Earache.  Headache.  Bad breath.  Decreased sense of smell and taste.  A cough, which worsens when you are lying flat.  Fatigue.  Fever.  Thick drainage from your nose, which often is green and may contain pus (purulent).  Swelling and warmth over the affected sinuses. DIAGNOSIS Your health care provider will perform a physical exam. During your exam, your health care provider may perform any of the following to help determine if you have  acute sinusitis or chronic sinusitis:  Look in your nose for signs of abnormal growths in your nostrils (nasal polyps).  Tap over the affected sinus to check for signs of infection.  View the inside of your sinuses using an imaging device that has a light attached (endoscope). If your health care provider suspects that you have chronic sinusitis, one or more of the following tests may be recommended:  Allergy tests.  Nasal culture. A sample of mucus is taken from your nose, sent to a lab, and screened for bacteria.  Nasal cytology. A sample of mucus is taken from your nose and examined by your health care provider to determine if your sinusitis is related to an allergy. TREATMENT Most cases of acute sinusitis are related to a viral infection and will resolve on their own within 10 days. Sometimes, medicines are prescribed to help relieve symptoms of both acute and chronic sinusitis. These may include pain medicines, decongestants, nasal steroid sprays, or saline sprays. However, for sinusitis related to a bacterial infection, your health care provider will prescribe antibiotic medicines. These are medicines that will help kill the bacteria causing the infection. Rarely, sinusitis is caused by a fungal infection. In these cases, your health care provider will prescribe antifungal medicine. For some cases of chronic sinusitis, surgery is needed. Generally, these are cases in which sinusitis recurs more than 3 times per year, despite other treatments. HOME CARE INSTRUCTIONS  Drink plenty of water. Water helps thin the mucus so your sinuses can drain more easily.  Use a humidifier.  Inhale steam 3-4 times a day (for example, sit in  the bathroom with the shower running).  Apply a warm, moist washcloth to your face 3-4 times a day, or as directed by your health care provider.  Use saline nasal sprays to help moisten and clean your sinuses.  Take medicines only as directed by your health care  provider.  If you were prescribed either an antibiotic or antifungal medicine, finish it all even if you start to feel better. SEEK IMMEDIATE MEDICAL CARE IF:  You have increasing pain or severe headaches.  You have nausea, vomiting, or drowsiness.  You have swelling around your face.  You have vision problems.  You have a stiff neck.  You have difficulty breathing.   This information is not intended to replace advice given to you by your health care provider. Make sure you discuss any questions you have with your health care provider.   Document Released: 10/18/2005 Document Revised: 11/08/2014 Document Reviewed: 11/02/2011 Elsevier Interactive Patient Education 2016 Elsevier Inc.  Nausea, Adult Nausea means you feel sick to your stomach or need to throw up (vomit). It may be a sign of a more serious problem. If nausea gets worse, you may throw up. If you throw up a lot, you may lose too much body fluid (dehydration). HOME CARE   Get plenty of rest.  Ask your doctor how to replace body fluid losses (rehydrate).  Eat small amounts of food. Sip liquids more often.  Take all medicines as told by your doctor. GET HELP RIGHT AWAY IF:  You have a fever.  You pass out (faint).  You keep throwing up or have blood in your throw up.  You are very weak, have dry lips or a dry mouth, or you are very thirsty (dehydrated).  You have dark or bloody poop (stool).  You have very bad chest or belly (abdominal) pain.  You do not get better after 2 days, or you get worse.  You have a headache. MAKE SURE YOU:  Understand these instructions.  Will watch your condition.  Will get help right away if you are not doing well or get worse.   This information is not intended to replace advice given to you by your health care provider. Make sure you discuss any questions you have with your health care provider.   Document Released: 10/07/2011 Document Revised: 01/10/2012 Document  Reviewed: 10/07/2011 Elsevier Interactive Patient Education [employer].

## 2016-01-16 NOTE — ED Provider Notes (Signed)
[facility]  ____________________________________________  Time seen: Approximately 12:44 PM  I have reviewed the triage vital signs and the nursing notes.   HISTORY  Chief Complaint Cough; Nasal Congestion; and Nausea   HPI Tiffany Andrade is a 40 y.o. female presents for complaint of 2-3 weeks of runny nose, nasal congestion, sinus drainage and cough. States some sinus pressure. Patient reports recently getting thick greenish nasal drainage when coughing occasionally blowing her nose. States her sinuses feel clogged. Patient also reports that several of her roommates and close family members have been sick recently with similar. States initially when she started getting sick she felt she had a fever but denies fever since. Also reports initially when she started feeling sick she had some nausea and diarrhea with one episode of vomiting , and reports others in household with similar. States no diarrhea today. Denies abdominal pain. States mild nausea today. States last night she had several episodes of diarrhea with some nausea as well again.   Reports symptoms have been unresolved with over-the-counter cough and congestion medications.  Reports has continued to eat and drink well. Denies fevers.Denies chest pain, chest pain with deep breath, shortness of breath, dizziness, weakness, hearing changes, abdominal pain, dysuria, neck pain, back pain, extremity pain or extremity swelling. Denies recent sickness.  Patient's last menstrual period was 12/30/2015. Denies chance of pregnancy.     Past Medical History  Diagnosis Date  . Hypertension   . UTI (lower urinary tract infection)   . DVT (deep venous thrombosis) ([place])     There are no active problems to display for this patient.   Past Surgical History  Procedure Laterality Date  . Tonsillectomy    . Cesarean section      x2  . Tubal ligation      Current Outpatient Rx  Name  Route  Sig  Dispense  Refill  .  amitriptyline (ELAVIL) 25 MG tablet   Oral   Take 25 mg by mouth at bedtime as needed for sleep.         Marland Kitchen amLODipine (NORVASC) 5 MG tablet   Oral   Take 5 mg by mouth daily.         . chlorthalidone (HYGROTON) 25 MG tablet   Oral   Take 12.5 mg by mouth daily.         Marland Kitchen FLUoxetine (PROZAC) 20 MG capsule   Oral   Take 60 mg by mouth daily.         .           . levonorgestrel (MIRENA) 20 MCG/24HR IUD   Intrauterine   20 each by Intrauterine route once.         Marland Kitchen lisinopril-hydrochlorothiazide (PRINZIDE,ZESTORETIC) 20-25 MG tablet   Oral   Take 1 tablet by mouth daily.         Marland Kitchen losartan (COZAAR) 25 MG tablet   Oral   Take 25 mg by mouth daily.         . traZODone (DESYREL) 100 MG tablet   Oral   Take 100 mg by mouth at bedtime as needed. For sleep.         .           .           . lithium carbonate (ESKALITH) 450 MG CR tablet   Oral   Take 450 mg by mouth 3 (three) times daily.         Marland Kitchen           Marland Kitchen  Allergies Codeine; Ivp dye; Penicillins; Sulfa antibiotics; and Tape  Family History  Problem Relation Age of Onset  . Hypertension Mother   . Hypertension Sister   . Hypertension Brother     Social History Social History  Substance Use Topics  . Smoking status: Never Smoker   . Smokeless tobacco: Never Used  . Alcohol Use: No    Review of Systems Constitutional: No fever/chills Eyes: No visual changes. ENT: States mild sore throat. Positive runny nose, nasal congestion, sinus drainage, and cough. Cardiovascular: Denies chest pain. Respiratory: Denies shortness of breath. Gastrointestinal: No abdominal pain.  No nausea, no vomiting.  No diarrhea.  No constipation. Genitourinary: Negative for dysuria. Musculoskeletal: Negative for back pain. Skin: Negative for rash. Neurological: Negative for headaches, focal weakness or numbness.  10-point ROS otherwise  negative.  ____________________________________________   PHYSICAL EXAM:  VITAL SIGNS: ED Triage Vitals  Enc Vitals Group     BP 01/16/16 1148 146/109 mmHg     Pulse Rate 01/16/16 1148 80     Resp 01/16/16 1148 18     Temp 01/16/16 1148 97.8 F (36.6 C)     Temp Source 01/16/16 1148 Oral     SpO2 01/16/16 1148 100 %     Weight 01/16/16 1148 194 lb (87.998 kg)     Height 01/16/16 1148 4\' 11"  (1.499 m)     Head Cir --      Peak Flow --      Pain Score 01/16/16 1158 0     Pain Loc --      Pain Edu? --      Excl. in [place]? --    Constitutional: Alert and oriented. Well appearing and in no acute distress. Eyes: Conjunctivae are normal. PERRL. EOMI. Head: Atraumatic.Mild to moderate tenderness to palpation bilateral frontal; minimal tenderness to palpation bilateral maxillary sinuses. No swelling. No erythema.   Ears: no erythema, normal TMs bilaterally.   Nose: nasal congestion with bilateral nasal turbinate erythema and edema.   Mouth/Throat: Mucous membranes are moist.  Oropharynx non-erythematous.No tonsillar swelling or exudate.  Neck: No stridor.  No cervical spine tenderness to palpation. Hematological/Lymphatic/Immunilogical: No cervical lymphadenopathy. Cardiovascular: Normal rate, regular rhythm. Grossly normal heart sounds.  Good peripheral circulation. Respiratory: Normal respiratory effort.  No retractions. Lungs CTAB. No wheezes, rales or rhonchi. Good air movement.  Gastrointestinal: Soft and nontender. No distention. Normal Bowel sounds. No CVA tenderness. Musculoskeletal: No lower or upper extremity tenderness nor edema.  Bilateral pedal pulses equal and easily palpated. No cervical, thoracic or lumbar tenderness to palpation. No calf tenderness bilaterally.  Neurologic:  Normal speech and language. No gross focal neurologic deficits are appreciated. No gait instability. Skin:  Skin is warm, dry and intact. No rash noted. Psychiatric: Mood and affect are normal. Speech  and behavior are normal.  ____________________________________________   LABS (all labs ordered are listed, but only abnormal results are displayed)  Labs Reviewed  RAPID STREP SCREEN (NOT AT [facility])  CULTURE, GROUP A STREP [facility])     INITIAL IMPRESSION / ASSESSMENT AND PLAN / ED COURSE  Pertinent labs & imaging results that were available during my care of the patient were reviewed by me and considered in my medical decision making (see chart for details).   Very well appearing. No acute distress. 2-3 weeks of runny nose, nasal congestion, mild cough with occasional diarrhea and nausea. Reports continues to eat and drink well. Denies fevers. Lungs clear throughout, abdomen soft and nontender. Suspect sinusitis. Also, suspect  viral gastroenteritis, especially with others in household with similar. Will treat with oral doxycycline and prn zofran. Denies chance of pregnancy. Discussed indication, risks and benefits of medications with patient. Discussed photosensitivity with doxycycline. Encourage rest, fluids, BRAT diet, otc ibuprofen or tylenol as needed. Strep negative, will culture. Work note for today and tomorrow given.   Discussed follow up with Primary care physician this week. Discussed follow up and return parameters including no resolution or any worsening concerns. Patient verbalized understanding and agreed to plan.   ____________________________________________   FINAL CLINICAL IMPRESSION(S) / ED DIAGNOSES  Final diagnoses:  Acute frontal sinusitis, recurrence not specified  Nausea      Note: This dictation was prepared with Dragon dictation along with smaller phrase technology. Any transcriptional errors that result from this process are unintentional.    Marylene Land, NP 01/16/16 1347

## 2016-01-16 NOTE — ED Notes (Signed)
Patient started having symptoms of cough, congestion, and nausea 3 weeks ago and symptoms improved. One week ago symptoms of cough congestion and nausea returned. Patient has tried various OTC medications with minimal resolution of symptoms.

## 2016-01-18 LAB — CULTURE, GROUP A STREP (THRC)

## 2016-01-22 ENCOUNTER — Ambulatory Visit
Admission: EM | Admit: 2016-01-22 | Discharge: 2016-01-22 | Disposition: A | Discharge status: Home / Self Care | Payer: BLUE CROSS/BLUE SHIELD | Attending: Family Medicine | Admitting: Family Medicine

## 2016-01-22 ENCOUNTER — Ambulatory Visit (INDEPENDENT_AMBULATORY_CARE_PROVIDER_SITE_OTHER): Payer: BLUE CROSS/BLUE SHIELD

## 2016-01-22 ENCOUNTER — Encounter: Payer: Self-pay | Admitting: *Deleted

## 2016-01-22 DIAGNOSIS — J209 Acute bronchitis, unspecified: Secondary | ICD-10-CM

## 2016-01-22 MED ORDER — ALBUTEROL SULFATE HFA 108 (90 BASE) MCG/ACT IN AERS: 1.0000 | INHALATION_SPRAY | Freq: Four times a day (QID) | RESPIRATORY_TRACT | Status: DC | PRN

## 2016-01-22 MED ORDER — PREDNISONE 20 MG PO TABS: ORAL_TABLET | ORAL | Status: DC

## 2016-01-22 MED ORDER — BENZONATATE 100 MG PO CAPS
100.0000 mg | ORAL_CAPSULE | Freq: Three times a day (TID) | ORAL | Status: DC
Start: 2016-01-22 — End: 2016-08-12

## 2016-01-22 NOTE — ED Provider Notes (Signed)
CSN: [id number]     Arrival date & time 01/22/16  1555 History   First MD Initiated Contact with Patient 01/22/16 1721     Chief Complaint  Patient presents with  . Cough   (Consider location/radiation/quality/duration/timing/severity/associated sxs/prior Treatment) HPI: Presents today with symptoms of worsening cough and rhinorrhea since seen last week. Patient was prescribed doxycycline. Patient has been taking this medication but says that she has not had significant relief. She admits to subjective fever but is afebrile in the office today. She denies any history of asthma or COPD. She has been taking over-the-counter cough medicines for her symptoms. Her coughing is causing her back pain and headache. She denies any bloody sputum.  Past Medical History  Diagnosis Date  . Hypertension   . UTI (lower urinary tract infection)   . DVT (deep venous thrombosis) [facility])    Past Surgical History  Procedure Laterality Date  . Tonsillectomy    . Cesarean section      x2  . Tubal ligation     Family History  Problem Relation Age of Onset  . Hypertension Mother   . Hypertension Sister   . Hypertension Brother    Social History  Substance Use Topics  . Smoking status: Never Smoker   . Smokeless tobacco: Never Used  . Alcohol Use: No   OB History    No data available     Review of Systems: Negative except mentioned above.  Allergies  Codeine; Ivp dye; Penicillins; Sulfa antibiotics; and Tape  Home Medications   Prior to Admission medications   Medication Sig Start Date End Date Taking? Authorizing Provider  amitriptyline (ELAVIL) 25 MG tablet Take 25 mg by mouth at bedtime as needed for sleep.    Historical Provider, MD  amLODipine (NORVASC) 5 MG tablet Take 5 mg by mouth daily.    Historical Provider, MD  cefpodoxime (VANTIN) 200 MG tablet Take 1 tablet (200 mg total) by mouth 2 (two) times daily. 04/12/15   Lisa Roca, MD  chlorthalidone (HYGROTON) 25 MG tablet Take 12.5 mg  by mouth daily.    Historical Provider, MD  doxycycline (VIBRAMYCIN) 100 MG capsule Take 1 capsule (100 mg total) by mouth 2 (two) times daily. 01/16/16   Marylene Land, NP  FLUoxetine (PROZAC) 20 MG capsule Take 60 mg by mouth daily.    Historical Provider, MD  HYDROcodone-acetaminophen (NORCO/VICODIN) 5-325 MG per tablet Take 1 tablet by mouth every 6 (six) hours as needed for moderate pain. 04/12/15   Lisa Roca, MD  ibuprofen (ADVIL,MOTRIN) 200 MG tablet Take 800 mg by mouth every 8 (eight) hours as needed for headache, mild pain or moderate pain.    Historical Provider, MD  levonorgestrel (MIRENA) 20 MCG/24HR IUD 20 each by Intrauterine route once. 09/25/13   Historical Provider, MD  lisinopril-hydrochlorothiazide (PRINZIDE,ZESTORETIC) 20-25 MG tablet Take 1 tablet by mouth daily.    Historical Provider, MD  lithium carbonate (ESKALITH) 450 MG CR tablet Take 450 mg by mouth 3 (three) times daily.    Historical Provider, MD  losartan (COZAAR) 25 MG tablet Take 25 mg by mouth daily.    Historical Provider, MD  meloxicam (MOBIC) 15 MG tablet Take 1 tablet (15 mg total) by mouth daily. 10/10/15   Frederich Cha, MD  ondansetron (ZOFRAN ODT) 4 MG disintegrating tablet Take 1 tablet (4 mg total) by mouth every 8 (eight) hours as needed for nausea or vomiting. 01/16/16   Marylene Land, NP  ondansetron (ZOFRAN) 4 MG tablet Take  1 tablet (4 mg total) by mouth every 8 (eight) hours as needed for nausea or vomiting. 04/12/15   Lisa Roca, MD  traZODone (DESYREL) 100 MG tablet Take 100 mg by mouth at bedtime as needed. For sleep.    Historical Provider, MD   Meds Ordered and Administered this Visit  Medications - No data to display  BP 144/98 mmHg  Pulse 90  Temp(Src) 97.7 F (36.5 C) (Oral)  Resp 20  SpO2 99%  LMP 12/30/2015 No data found.   Physical Exam   GENERAL: NAD HEENT: mild pharyngeal erythema, no exudate, no erythema of TMs, no cervical LAD RESP: CTA B CARD: RRR NEURO: CN II-XII  grossly intact   ED Course  Procedures (including critical care time)  Labs Review Labs Reviewed - No data to display  Imaging Review No results found.     MDM   A/P: Bronchitis- Discussed with patient that she should continue taking her antibiotic as prescribed, could still be a viral source, will treat with oral prednisone for a few days, Tessalon Perles when necessary, chest x-ray did not show any infiltrate. I do recommend that patient follow up with her primary care physician if her symptoms do persist/worsen.  Paulina Fusi, MD 01/22/16 [phone]

## 2016-01-22 NOTE — ED Notes (Signed)
Patient symptoms have become worse since last visit and while on prescribed antibiotics.

## 2016-03-07 ENCOUNTER — Ambulatory Visit (INDEPENDENT_AMBULATORY_CARE_PROVIDER_SITE_OTHER): Payer: BLUE CROSS/BLUE SHIELD

## 2016-03-07 ENCOUNTER — Ambulatory Visit
Admission: EM | Admit: 2016-03-07 | Discharge: 2016-03-07 | Disposition: A | Discharge status: Home / Self Care | Payer: BLUE CROSS/BLUE SHIELD | Attending: Family Medicine | Admitting: Family Medicine

## 2016-03-07 ENCOUNTER — Encounter: Payer: Self-pay | Admitting: Gynecology

## 2016-03-07 DIAGNOSIS — S161XXA Strain of muscle, fascia and tendon at neck level, initial encounter: Secondary | ICD-10-CM | POA: Diagnosis not present

## 2016-03-07 MED ORDER — METAXALONE 800 MG PO TABS: 800.0000 mg | ORAL_TABLET | Freq: Three times a day (TID) | ORAL | Status: DC

## 2016-03-07 MED ORDER — NAPROXEN 500 MG PO TABS: 500.0000 mg | ORAL_TABLET | Freq: Two times a day (BID) | ORAL | Status: DC

## 2016-03-07 NOTE — Discharge Instructions (Signed)

## 2016-03-07 NOTE — ED Notes (Signed)
Per patient while hanging laundry this morning, right foot fell in a hole and twisted ankle and fell on right arm. Pt. Now with right arm pain.

## 2016-03-07 NOTE — ED Provider Notes (Signed)
CSN: [id number]     Arrival date & time 03/07/16  1314 History   First MD Initiated Contact with Patient 03/07/16 1413     Chief Complaint  Patient presents with  . Arm Injury   (Consider location/radiation/quality/duration/timing/severity/associated sxs/prior Treatment) HPI  This a 40 year old female who states that while hanging laundry she inadvertently stepped into a hole that had been dug by her chickens twisted to her right ankle into inversion fell onto her knee and her right elbow and landed with her head forcefully twisted into lateral flexion to the left on the chain link fence around the chicken coop. She states now she is having pain in her right upper arm her neck in her trapezius on the right. She has some stiffness in the right ankle and the right knee. He states she very frequently twists her ankles without sustaining significant injuries. She has pain in her right shoulder with initiation of motion away from her side. Her neck is very tender posteriorly and into the right trapezial muscle. She denies any numbness or tingling.    Past Medical History  Diagnosis Date  . Hypertension   . UTI (lower urinary tract infection)   . DVT (deep venous thrombosis) [facility])    Past Surgical History  Procedure Laterality Date  . Tonsillectomy    . Cesarean section      x2  . Tubal ligation     Family History  Problem Relation Age of Onset  . Hypertension Mother   . Hypertension Sister   . Hypertension Brother    Social History  Substance Use Topics  . Smoking status: Never Smoker   . Smokeless tobacco: Never Used  . Alcohol Use: No   OB History    No data available     Review of Systems  Constitutional: Positive for activity change. Negative for fever, chills and fatigue.  Musculoskeletal: Positive for myalgias, neck pain and neck stiffness.  All other systems reviewed and are negative.   Allergies  Codeine; Ivp dye; Penicillins; Sulfa antibiotics; and Tape  Home  Medications   Prior to Admission medications   Medication Sig Start Date End Date Taking? Authorizing Provider  albuterol (PROVENTIL HFA;VENTOLIN HFA) 108 (90 Base) MCG/ACT inhaler Inhale 1-2 puffs into the lungs every 6 (six) hours as needed for wheezing or shortness of breath. 01/22/16  Yes Paulina Fusi, MD  amitriptyline (ELAVIL) 25 MG tablet Take 25 mg by mouth at bedtime as needed for sleep.   Yes Historical Provider, MD  amLODipine (NORVASC) 5 MG tablet Take 5 mg by mouth daily.   Yes Historical Provider, MD  benzonatate (TESSALON) 100 MG capsule Take 1 capsule (100 mg total) by mouth every 8 (eight) hours. 01/22/16  Yes Paulina Fusi, MD  cefpodoxime (VANTIN) 200 MG tablet Take 1 tablet (200 mg total) by mouth 2 (two) times daily. 04/12/15  Yes Lisa Roca, MD  chlorthalidone (HYGROTON) 25 MG tablet Take 12.5 mg by mouth daily.   Yes Historical Provider, MD  doxycycline (VIBRAMYCIN) 100 MG capsule Take 1 capsule (100 mg total) by mouth 2 (two) times daily. 01/16/16  Yes Marylene Land, NP  FLUoxetine (PROZAC) 20 MG capsule Take 60 mg by mouth daily.   Yes Historical Provider, MD  HYDROcodone-acetaminophen (NORCO/VICODIN) 5-325 MG per tablet Take 1 tablet by mouth every 6 (six) hours as needed for moderate pain. 04/12/15  Yes Lisa Roca, MD  ibuprofen (ADVIL,MOTRIN) 200 MG tablet Take 800 mg by mouth every 8 (eight) hours as needed  for headache, mild pain or moderate pain.   Yes Historical Provider, MD  levonorgestrel (MIRENA) 20 MCG/24HR IUD 20 each by Intrauterine route once. 09/25/13  Yes Historical Provider, MD  lisinopril-hydrochlorothiazide (PRINZIDE,ZESTORETIC) 20-25 MG tablet Take 1 tablet by mouth daily.   Yes Historical Provider, MD  lithium carbonate (ESKALITH) 450 MG CR tablet Take 450 mg by mouth 3 (three) times daily.   Yes Historical Provider, MD  losartan (COZAAR) 25 MG tablet Take 25 mg by mouth daily.   Yes Historical Provider, MD  meloxicam (MOBIC) 15 MG tablet Take 1 tablet  (15 mg total) by mouth daily. 10/10/15  Yes Frederich Cha, MD  ondansetron (ZOFRAN ODT) 4 MG disintegrating tablet Take 1 tablet (4 mg total) by mouth every 8 (eight) hours as needed for nausea or vomiting. 01/16/16  Yes Marylene Land, NP  ondansetron (ZOFRAN) 4 MG tablet Take 1 tablet (4 mg total) by mouth every 8 (eight) hours as needed for nausea or vomiting. 04/12/15  Yes Lisa Roca, MD  predniSONE (DELTASONE) 20 MG tablet 2 tabs PO qd x 4 days. 01/22/16  Yes Paulina Fusi, MD  traZODone (DESYREL) 100 MG tablet Take 100 mg by mouth at bedtime as needed. For sleep.   Yes Historical Provider, MD  metaxalone (SKELAXIN) 800 MG tablet Take 1 tablet (800 mg total) by mouth 3 (three) times daily. 03/07/16   Lorin Picket, PA-C  naproxen (NAPROSYN) 500 MG tablet Take 1 tablet (500 mg total) by mouth 2 (two) times daily with a meal. 03/07/16   Lorin Picket, PA-C   Meds Ordered and Administered this Visit  Medications - No data to display  BP 142/88 mmHg  Pulse 72  Temp(Src) 97.8 F (36.6 C) (Oral)  Resp 16  Ht 4\' 11"  (1.499 m)  Wt 193 lb (87.544 kg)  BMI 38.96 kg/m2  SpO2 100%  LMP 03/01/2016 No data found.   Physical Exam  Constitutional: She is oriented to person, place, and time. She appears well-developed and well-nourished. No distress.  HENT:  Head: Normocephalic and atraumatic.  Eyes: Conjunctivae are normal. Pupils are equal, round, and reactive to light.  Neck: Neck supple.  Examination of the cervical spine shows tenderness to palpation in the right paraspinous muscles of the cervical spine extending into the right trapezial muscle. Range of motion is full with discomfort at the extremes of leftward lateral flexion and leftward lateral flexion in extension. Lateral rotation is also uncomfortable. Shoulder range of motion is full. He has a positive arm raise test but is able to hold it against gravity. Elbow range of motion is full to flexion and extension pronation supination.  Wrist is also full as are the fingers. Her right ankle shows a normal range of motion is no ecchymosis or swelling appreciated. The knee has a small superficial abrasion anteriorly.  Musculoskeletal: Normal range of motion. She exhibits tenderness.  Refer to cervical spine exam  Lymphadenopathy:    She has no cervical adenopathy.  Neurological: She is alert and oriented to person, place, and time. She displays normal reflexes. She exhibits normal muscle tone. Coordination normal.  Skin: Skin is warm and dry. She is not diaphoretic.  Psychiatric: She has a normal mood and affect. Her behavior is normal. Judgment and thought content normal.  Nursing note and vitals reviewed.   ED Course  Procedures (including critical care time)  Labs Review Labs Reviewed - No data to display  Imaging Review Dg Cervical Spine Complete  03/07/2016  CLINICAL  DATA:  Acute right cervical spine pain following fall today. Initial encounter. EXAM: CERVICAL SPINE - COMPLETE 4+ VIEW COMPARISON:  None. FINDINGS: There is no evidence of cervical spine fracture or prevertebral soft tissue swelling. Alignment is normal. No other significant bone abnormalities are identified. IMPRESSION: Negative cervical spine radiographs. Electronically Signed   By: Margarette Canada M.D.   On: 03/07/2016 15:44     Visual Acuity Review  Right Eye Distance:   Left Eye Distance:   Bilateral Distance:    Right Eye Near:   Left Eye Near:    Bilateral Near:         MDM   1. Cervical strain, acute, initial encounter    Discharge Medication List as of 03/07/2016  4:10 PM    START taking these medications   Details  metaxalone (SKELAXIN) 800 MG tablet Take 1 tablet (800 mg total) by mouth 3 (three) times daily., Starting 03/07/2016, Until Discontinued, Normal    naproxen (NAPROSYN) 500 MG tablet Take 1 tablet (500 mg total) by mouth 2 (two) times daily with a meal., Starting 03/07/2016, Until Discontinued, Normal      Plan: 1.  Test/x-ray results and diagnosis reviewed with patient 2. rx as per orders; risks, benefits, potential side effects reviewed with patient 3. Recommend supportive treatment with rest and symptom avoidance. Use ice for 24 hours and follow with heat. Recommended that she follow-up with her primary at [facility] has an appointment for May 18. Is not improving she should see be seen sooner. 4. F/u prn if symptoms worsen or don't improve     Lorin Picket, PA-C 03/07/16 1614

## 2016-04-04 ENCOUNTER — Ambulatory Visit
Admission: EM | Admit: 2016-04-04 | Discharge: 2016-04-04 | Disposition: A | Discharge status: Home / Self Care | Payer: BLUE CROSS/BLUE SHIELD | Attending: Family Medicine | Admitting: Family Medicine

## 2016-04-04 DIAGNOSIS — R319 Hematuria, unspecified: Secondary | ICD-10-CM | POA: Diagnosis not present

## 2016-04-04 DIAGNOSIS — N390 Urinary tract infection, site not specified: Secondary | ICD-10-CM | POA: Diagnosis not present

## 2016-04-04 HISTORY — DX: Depression, unspecified: F32.A

## 2016-04-04 HISTORY — DX: Anxiety disorder, unspecified: F41.9

## 2016-04-04 HISTORY — DX: Major depressive disorder, single episode, unspecified: F32.9

## 2016-04-04 LAB — URINALYSIS COMPLETE WITH MICROSCOPIC (ARMC ONLY)
BILIRUBIN URINE: NEGATIVE
GLUCOSE, UA: NEGATIVE mg/dL
Ketones, ur: NEGATIVE mg/dL
Nitrite: NEGATIVE
PH: 5.5 (ref 5.0–8.0)
Protein, ur: NEGATIVE mg/dL
Specific Gravity, Urine: 1.015 (ref 1.005–1.030)

## 2016-04-04 MED ORDER — PHENAZOPYRIDINE HCL 100 MG PO TABS: 100.0000 mg | ORAL_TABLET | Freq: Three times a day (TID) | ORAL | Status: DC | PRN

## 2016-04-04 MED ORDER — NITROFURANTOIN MONOHYD MACRO 100 MG PO CAPS: 100.0000 mg | ORAL_CAPSULE | Freq: Two times a day (BID) | ORAL | Status: DC

## 2016-04-04 NOTE — ED Provider Notes (Signed)
CSN: [id number]     Arrival date & time 04/04/16  1219 History   First MD Initiated Contact with Patient 04/04/16 1505     Chief Complaint  Patient presents with  . Urinary Frequency    Pt reports diagnosed with UTI 2 weeks ago and just finished Clindamycin today. Hesitancy, frequency, and stabbing pains with urination. 5/10.    HPI  Tiffany Andrade is a pleasant 40 y.o. female who presents with hesitancy, urinary frequency & stabbing intermittent pains with urination.  She completed clindamycin today for UTI.  Pain 5/10 at worst.  Denies feer, chills, nausea or vomiting.  Hx recurrent UTIs.  Denies flank pain.  Past Medical History  Diagnosis Date  . Hypertension   . UTI (lower urinary tract infection)   . DVT (deep venous thrombosis) ([place])   . Anxiety   . Depression    Past Surgical History  Procedure Laterality Date  . Tonsillectomy    . Cesarean section      x2  . Tubal ligation    . Kidney surgery     Family History  Problem Relation Age of Onset  . Hypertension Mother   . Hypertension Sister   . Hypertension Brother    Social History  Substance Use Topics  . Smoking status: Never Smoker   . Smokeless tobacco: Never Used  . Alcohol Use: No   OB History    No data available     Review of Systems  Constitutional: Positive for fatigue.  HENT: Negative.   Eyes: Negative.   Respiratory: Negative.   Cardiovascular: Negative.   Gastrointestinal: Negative.   Genitourinary: Positive for dysuria, frequency, difficulty urinating and pelvic pain. Negative for hematuria.  Musculoskeletal: Negative.   Skin: Negative.   Neurological: Negative.   Hematological: Negative.   Psychiatric/Behavioral: Negative.     Allergies  Codeine; Ivp dye; Penicillins; Sulfa antibiotics; and Tape  Home Medications   Prior to Admission medications   Medication Sig Start Date End Date Taking? Authorizing Provider  amitriptyline (ELAVIL) 25 MG tablet Take 25 mg by mouth at bedtime as  needed for sleep.   Yes Historical Provider, MD  amLODipine (NORVASC) 5 MG tablet Take 5 mg by mouth daily.   Yes Historical Provider, MD  FLUoxetine (PROZAC) 20 MG capsule Take 60 mg by mouth daily.   Yes Historical Provider, MD  ibuprofen (ADVIL,MOTRIN) 200 MG tablet Take 800 mg by mouth every 8 (eight) hours as needed for headache, mild pain or moderate pain.   Yes Historical Provider, MD  levonorgestrel (MIRENA) 20 MCG/24HR IUD 20 each by Intrauterine route once. 09/25/13  Yes Historical Provider, MD  lisinopril-hydrochlorothiazide (PRINZIDE,ZESTORETIC) 20-25 MG tablet Take 1 tablet by mouth daily.   Yes Historical Provider, MD  potassium chloride (K-DUR,KLOR-CON) 10 MEQ tablet Take 10 mEq by mouth 2 (two) times daily.   Yes Historical Provider, MD  QUEtiapine (SEROQUEL) 25 MG tablet Take 100 mg by mouth at bedtime.   Yes Historical Provider, MD  SUMAtriptan (IMITREX) 50 MG tablet Take 50 mg by mouth every 2 (two) hours as needed for migraine. May repeat in 2 hours if headache persists or recurs.   Yes Historical Provider, MD  traZODone (DESYREL) 100 MG tablet Take 100 mg by mouth at bedtime as needed. For sleep.   Yes Historical Provider, MD  albuterol (PROVENTIL HFA;VENTOLIN HFA) 108 (90 Base) MCG/ACT inhaler Inhale 1-2 puffs into the lungs every 6 (six) hours as needed for wheezing or shortness of breath. 01/22/16  Paulina Fusi, MD  benzonatate (TESSALON) 100 MG capsule Take 1 capsule (100 mg total) by mouth every 8 (eight) hours. 01/22/16   Paulina Fusi, MD  cefpodoxime (VANTIN) 200 MG tablet Take 1 tablet (200 mg total) by mouth 2 (two) times daily. 04/12/15   Lisa Roca, MD  chlorthalidone (HYGROTON) 25 MG tablet Take 12.5 mg by mouth daily.    Historical Provider, MD  doxycycline (VIBRAMYCIN) 100 MG capsule Take 1 capsule (100 mg total) by mouth 2 (two) times daily. 01/16/16   Marylene Land, NP  HYDROcodone-acetaminophen (NORCO/VICODIN) 5-325 MG per tablet Take 1 tablet by mouth every 6  (six) hours as needed for moderate pain. 04/12/15   Lisa Roca, MD  lithium carbonate (ESKALITH) 450 MG CR tablet Take 450 mg by mouth 3 (three) times daily.    Historical Provider, MD  losartan (COZAAR) 25 MG tablet Take 25 mg by mouth daily.    Historical Provider, MD  meloxicam (MOBIC) 15 MG tablet Take 1 tablet (15 mg total) by mouth daily. 10/10/15   Frederich Cha, MD  metaxalone (SKELAXIN) 800 MG tablet Take 1 tablet (800 mg total) by mouth 3 (three) times daily. 03/07/16   Lorin Picket, PA-C  naproxen (NAPROSYN) 500 MG tablet Take 1 tablet (500 mg total) by mouth 2 (two) times daily with a meal. 03/07/16   Lorin Picket, PA-C  nitrofurantoin, macrocrystal-monohydrate, (MACROBID) 100 MG capsule Take 1 capsule (100 mg total) by mouth 2 (two) times daily. 04/04/16   Andria Meuse, NP  ondansetron (ZOFRAN ODT) 4 MG disintegrating tablet Take 1 tablet (4 mg total) by mouth every 8 (eight) hours as needed for nausea or vomiting. 01/16/16   Marylene Land, NP  ondansetron (ZOFRAN) 4 MG tablet Take 1 tablet (4 mg total) by mouth every 8 (eight) hours as needed for nausea or vomiting. 04/12/15   Lisa Roca, MD  phenazopyridine (PYRIDIUM) 100 MG tablet Take 1 tablet (100 mg total) by mouth 3 (three) times daily as needed for pain. 04/04/16   Andria Meuse, NP  predniSONE (DELTASONE) 20 MG tablet 2 tabs PO qd x 4 days. 01/22/16   Paulina Fusi, MD   Meds Ordered and Administered this Visit  Medications - No data to display  BP 149/108 mmHg  Pulse 64  Temp(Src) 97.4 F (36.3 C) (Oral)  Resp 18  Ht 4\' 11"  (1.499 m)  Wt 188 lb (85.276 kg)  BMI 37.95 kg/m2  SpO2 100%  LMP 04/04/2016 No data found.   Physical Exam  Constitutional: She is oriented to person, place, and time. She appears well-developed and well-nourished. No distress.  HENT:  Head: Normocephalic and atraumatic.  Eyes: Conjunctivae are normal. No scleral icterus.  Neck: Normal range of motion.  Cardiovascular: Normal rate and  regular rhythm.   Pulmonary/Chest: Effort normal and breath sounds normal. No respiratory distress.  Abdominal: Soft. Normal appearance, normal aorta and bowel sounds are normal. She exhibits no distension and no mass. There is no hepatosplenomegaly. There is tenderness in the suprapubic area. There is no rigidity, no rebound, no guarding and no CVA tenderness. No hernia.  Musculoskeletal: Normal range of motion. She exhibits no edema or tenderness.  Neurological: She is alert and oriented to person, place, and time. No cranial nerve deficit.  Skin: Skin is warm and dry. No rash noted. No erythema.  Psychiatric: Her behavior is normal. Judgment normal.  Nursing note and vitals reviewed.   ED Course  Procedures (including critical care time)  Labs Review Labs Reviewed  URINALYSIS COMPLETEWITH MICROSCOPIC ([facility] ONLY) - Abnormal; Notable for the following:    Color, Urine AMBER (*)    APPearance HAZY (*)    Hgb urine dipstick 3+ (*)    Leukocytes, UA TRACE (*)    Bacteria, UA RARE (*)    Squamous Epithelial / LPF 0-5 (*)    All other components within normal limits  URINE CULTURE    MDM   1. Recurrent UTI (urinary tract infection)   2. Hematuria    Plan: Test results and diagnosis reviewed with patient Rx as per orders;  benefits, risks, potential side effects reviewed  Recommend supportive treatment with incresed fluids Seek additional medical care if symptoms worsen or are not improving FU with PCP in 1 week for chronic hematuria & UTIs Urine culture is pending at time of discharge     Andria Meuse, NP 04/05/16 [phone]

## 2016-04-04 NOTE — Discharge Instructions (Signed)

## 2016-04-29 ENCOUNTER — Ambulatory Visit
Admission: EM | Admit: 2016-04-29 | Discharge: 2016-04-29 | Disposition: A | Discharge status: Home / Self Care | Payer: BLUE CROSS/BLUE SHIELD | Attending: Family Medicine | Admitting: Family Medicine

## 2016-04-29 DIAGNOSIS — N390 Urinary tract infection, site not specified: Secondary | ICD-10-CM

## 2016-04-29 LAB — URINALYSIS COMPLETE WITH MICROSCOPIC (ARMC ONLY): BACTERIA UA: NONE SEEN

## 2016-04-29 MED ORDER — CIPROFLOXACIN HCL 500 MG PO TABS: 500.0000 mg | ORAL_TABLET | Freq: Two times a day (BID) | ORAL | Status: DC

## 2016-04-29 MED ORDER — FLUCONAZOLE 150 MG PO TABS: ORAL_TABLET | ORAL | Status: DC

## 2016-04-29 NOTE — ED Provider Notes (Signed)
CSN: [id number]     Arrival date & time 04/29/16  [id number] History   First MD Initiated Contact with Patient 04/29/16 1124     Chief Complaint  Patient presents with  . Urinary Tract Infection   (Consider location/radiation/quality/duration/timing/severity/associated sxs/prior Treatment) HPI  Is a 40 year old female who returns planing of recurrent UTI. She states that her symptoms started 3-4 weeks ago and she is finished 2 rounds of antibiotics. Her symptoms improved initially but returned about this ago. Been having dysuria frequency and decreased output. In addition she states that she is had swelling in the genital area along with some tenderness even with sitting but denies any vaginal discharge. Is sexually active in a monogamous relationship. Denies any fever or chills. In review of her previous laboratory she had no WBCs but that too numerous to count RBCs. She received her primary care through [facility] clinic.        Past Medical History  Diagnosis Date  . Hypertension   . UTI (lower urinary tract infection)   . DVT (deep venous thrombosis) ([place])   . Anxiety   . Depression    Past Surgical History  Procedure Laterality Date  . Tonsillectomy    . Cesarean section      x2  . Tubal ligation    . Kidney surgery      X2   Family History  Problem Relation Age of Onset  . Hypertension Mother   . Hypertension Sister   . Hypertension Brother    Social History  Substance Use Topics  . Smoking status: Never Smoker   . Smokeless tobacco: Never Used  . Alcohol Use: No   OB History    No data available     Review of Systems  Constitutional: Positive for activity change. Negative for fever, chills and fatigue.  Gastrointestinal: Negative for abdominal pain.  Genitourinary: Positive for dysuria, urgency, frequency, flank pain, decreased urine volume and genital sores. Negative for vaginal bleeding, vaginal discharge and vaginal pain.  All other systems reviewed and  are negative.   Allergies  Codeine; Ivp dye; Penicillins; Sulfa antibiotics; and Tape  Home Medications   Prior to Admission medications   Medication Sig Start Date End Date Taking? Authorizing Provider  albuterol (PROVENTIL HFA;VENTOLIN HFA) 108 (90 Base) MCG/ACT inhaler Inhale 1-2 puffs into the lungs every 6 (six) hours as needed for wheezing or shortness of breath. 01/22/16   Paulina Fusi, MD  amitriptyline (ELAVIL) 25 MG tablet Take 25 mg by mouth at bedtime as needed for sleep.    Historical Provider, MD  amLODipine (NORVASC) 5 MG tablet Take 5 mg by mouth daily.    Historical Provider, MD  benzonatate (TESSALON) 100 MG capsule Take 1 capsule (100 mg total) by mouth every 8 (eight) hours. 01/22/16   Paulina Fusi, MD  cefpodoxime (VANTIN) 200 MG tablet Take 1 tablet (200 mg total) by mouth 2 (two) times daily. 04/12/15   Lisa Roca, MD  chlorthalidone (HYGROTON) 25 MG tablet Take 12.5 mg by mouth daily.    Historical Provider, MD  ciprofloxacin (CIPRO) 500 MG tablet Take 1 tablet (500 mg total) by mouth 2 (two) times daily. 04/29/16   Lorin Picket, PA-C  doxycycline (VIBRAMYCIN) 100 MG capsule Take 1 capsule (100 mg total) by mouth 2 (two) times daily. 01/16/16   Marylene Land, NP  fluconazole (DIFLUCAN) 150 MG tablet Take one tab for symptoms of yeast infection. Repeat x 1 in 72 hours. 04/29/16   Malachy Chamber  Lynwood Dawley, PA-C  FLUoxetine (PROZAC) 20 MG capsule Take 60 mg by mouth daily.    Historical Provider, MD  HYDROcodone-acetaminophen (NORCO/VICODIN) 5-325 MG per tablet Take 1 tablet by mouth every 6 (six) hours as needed for moderate pain. 04/12/15   Lisa Roca, MD  ibuprofen (ADVIL,MOTRIN) 200 MG tablet Take 800 mg by mouth every 8 (eight) hours as needed for headache, mild pain or moderate pain.    Historical Provider, MD  levonorgestrel (MIRENA) 20 MCG/24HR IUD 20 each by Intrauterine route once. 09/25/13   Historical Provider, MD  lisinopril-hydrochlorothiazide  (PRINZIDE,ZESTORETIC) 20-25 MG tablet Take 1 tablet by mouth daily.    Historical Provider, MD  lithium carbonate (ESKALITH) 450 MG CR tablet Take 450 mg by mouth 3 (three) times daily.    Historical Provider, MD  losartan (COZAAR) 25 MG tablet Take 25 mg by mouth daily.    Historical Provider, MD  meloxicam (MOBIC) 15 MG tablet Take 1 tablet (15 mg total) by mouth daily. 10/10/15   Frederich Cha, MD  metaxalone (SKELAXIN) 800 MG tablet Take 1 tablet (800 mg total) by mouth 3 (three) times daily. 03/07/16   Lorin Picket, PA-C  naproxen (NAPROSYN) 500 MG tablet Take 1 tablet (500 mg total) by mouth 2 (two) times daily with a meal. 03/07/16   Lorin Picket, PA-C  nitrofurantoin, macrocrystal-monohydrate, (MACROBID) 100 MG capsule Take 1 capsule (100 mg total) by mouth 2 (two) times daily. 04/04/16   Andria Meuse, NP  ondansetron (ZOFRAN ODT) 4 MG disintegrating tablet Take 1 tablet (4 mg total) by mouth every 8 (eight) hours as needed for nausea or vomiting. 01/16/16   Marylene Land, NP  ondansetron (ZOFRAN) 4 MG tablet Take 1 tablet (4 mg total) by mouth every 8 (eight) hours as needed for nausea or vomiting. 04/12/15   Lisa Roca, MD  phenazopyridine (PYRIDIUM) 100 MG tablet Take 1 tablet (100 mg total) by mouth 3 (three) times daily as needed for pain. 04/04/16   Andria Meuse, NP  potassium chloride (K-DUR,KLOR-CON) 10 MEQ tablet Take 10 mEq by mouth 2 (two) times daily.    Historical Provider, MD  predniSONE (DELTASONE) 20 MG tablet 2 tabs PO qd x 4 days. 01/22/16   Paulina Fusi, MD  QUEtiapine (SEROQUEL) 25 MG tablet Take 100 mg by mouth at bedtime.    Historical Provider, MD  SUMAtriptan (IMITREX) 50 MG tablet Take 50 mg by mouth every 2 (two) hours as needed for migraine. May repeat in 2 hours if headache persists or recurs.    Historical Provider, MD  traZODone (DESYREL) 100 MG tablet Take 100 mg by mouth at bedtime as needed. For sleep.    Historical Provider, MD   Meds Ordered and  Administered this Visit  Medications - No data to display  BP 137/91 mmHg  Pulse 104  Temp(Src) 97.3 F (36.3 C) (Tympanic)  Resp 16  Ht 4\' 11"  (1.499 m)  Wt 189 lb (85.73 kg)  BMI 38.15 kg/m2  SpO2 100%  LMP 04/04/2016 No data found.   Physical Exam  Constitutional: She is oriented to person, place, and time. She appears well-developed and well-nourished. No distress.  HENT:  Head: Normocephalic and atraumatic.  Eyes: Conjunctivae are normal. Pupils are equal, round, and reactive to light.  Neck: Normal range of motion. Neck supple.  Genitourinary:  Exam is deferred to primary care  Musculoskeletal: Normal range of motion.  Neurological: She is alert and oriented to person, place, and time.  Skin:  Skin is warm and dry. She is not diaphoretic.  Psychiatric: She has a normal mood and affect. Her behavior is normal. Judgment normal.  Nursing note and vitals reviewed.   ED Course  Procedures (including critical care time)  Labs Review Labs Reviewed  URINALYSIS COMPLETEWITH MICROSCOPIC ([facility] ONLY) - Abnormal; Notable for the following:    Color, Urine ORANGE (*)    Glucose, UA   (*)    Value: TEST NOT REPORTED DUE TO COLOR INTERFERENCE OF URINE PIGMENT   Bilirubin Urine   (*)    Value: TEST NOT REPORTED DUE TO COLOR INTERFERENCE OF URINE PIGMENT   Ketones, ur   (*)    Value: TEST NOT REPORTED DUE TO COLOR INTERFERENCE OF URINE PIGMENT   Hgb urine dipstick   (*)    Value: TEST NOT REPORTED DUE TO COLOR INTERFERENCE OF URINE PIGMENT   Protein, ur   (*)    Value: TEST NOT REPORTED DUE TO COLOR INTERFERENCE OF URINE PIGMENT   Nitrite   (*)    Value: TEST NOT REPORTED DUE TO COLOR INTERFERENCE OF URINE PIGMENT   Leukocytes, UA   (*)    Value: TEST NOT REPORTED DUE TO COLOR INTERFERENCE OF URINE PIGMENT   Squamous Epithelial / LPF 0-5 (*)    All other components within normal limits  URINE CULTURE    Imaging Review No results found.   Visual Acuity Review  Right  Eye Distance:   Left Eye Distance:   Bilateral Distance:    Right Eye Near:   Left Eye Near:    Bilateral Near:         MDM   1. Recurrent UTI   Suspect possible yeast infection. We will culture urine. Discharge Medication List as of 04/29/2016 11:55 AM    START taking these medications   Details  ciprofloxacin (CIPRO) 500 MG tablet Take 1 tablet (500 mg total) by mouth 2 (two) times daily., Starting 04/29/2016, Until Discontinued, Normal    fluconazole (DIFLUCAN) 150 MG tablet Take one tab for symptoms of yeast infection. Repeat x 1 in 72 hours., Normal      Plan: 1. Test/x-ray results and diagnosis reviewed with patient 2. rx as per orders; risks, benefits, potential side effects reviewed with patient 3. Recommend supportive treatment with Increase fluids. She will follow-up with her primary care at [facility] clinic. We'll treat her empirically with Diflucan for possible yeast infection but told her she may need a pelvic with her primary care in order to ascertain exactly what is going on. 4. F/u prn if symptoms worsen or don't improve    Lorin Picket, PA-C 04/29/16 1209

## 2016-04-29 NOTE — Discharge Instructions (Signed)
Dysuria °Dysuria is pain or discomfort while urinating. The pain or discomfort may be felt in the tube that carries urine out of the bladder (urethra) or in the surrounding tissue of the genitals. The pain may also be felt in the groin area, lower abdomen, and lower back. You may have to urinate frequently or have the sudden feeling that you have to urinate (urgency). Dysuria can affect both men and women, but is more common in women. °Dysuria can be caused by many different things, including: °· Urinary tract infection in women. °· Infection of the kidney or bladder. °· Kidney stones or bladder stones. °· Certain sexually transmitted infections (STIs), such as chlamydia. °· Dehydration. °· Inflammation of the vagina. °· Use of certain medicines. °· Use of certain soaps or scented products that cause irritation. °HOME CARE INSTRUCTIONS °Watch your dysuria for any changes. The following actions may help to reduce any discomfort you are feeling: °· Drink enough fluid to keep your urine clear or pale yellow. °· Empty your bladder often. Avoid holding urine for long periods of time. °· After a bowel movement or urination, women should cleanse from front to back, using each tissue only once. °· Empty your bladder after sexual intercourse. °· Take medicines only as directed by your health care provider. °· If you were prescribed an antibiotic medicine, finish it all even if you start to feel better. °· Avoid caffeine, tea, and alcohol. They can irritate the bladder and make dysuria worse. In men, alcohol may irritate the prostate. °· Keep all follow-up visits as directed by your health care provider. This is important. °· If you had any tests done to find the cause of dysuria, it is your responsibility to obtain your test results. Ask the lab or department performing the test when and how you will get your results. Talk with your health care provider if you have any questions about your results. °SEEK MEDICAL CARE  IF: °· You develop pain in your back or sides. °· You have a fever. °· You have nausea or vomiting. °· You have blood in your urine. °· You are not urinating as often as you usually do. °SEEK IMMEDIATE MEDICAL CARE IF: °· You pain is severe and not relieved with medicines. °· You are unable to hold down any fluids. °· You or someone else notices a change in your mental function. °· You have a rapid heartbeat at rest. °· You have shaking or chills. °· You feel extremely weak. °  °This information is not intended to replace advice given to you by your health care provider. Make sure you discuss any questions you have with your health care provider. °  °Document Released: 07/16/2004 Document Revised: 11/08/2014 Document Reviewed: 06/13/2014 °Elsevier Interactive Patient Education ©2016 Else[employer] ° °Urinary Tract Infection °Urinary tract infections (UTIs) can develop anywhere along your urinary tract. Your urinary tract is your body's drainage system for removing wastes and extra water. Your urinary tract includes two kidneys, two ureters, a bladder, and a urethra. Your kidneys are a pair of bean-shaped organs. Each kidney is about the size of your fist. They are located below your ribs, one on each side of your spine. °CAUSES °Infections are caused by microbes, which are microscopic organisms, including fungi, viruses, and bacteria. These organisms are so small that they can only be seen through a microscope. Bacteria are the microbes that most commonly cause UTIs. °SYMPTOMS  °Symptoms of UTIs may vary by age and gender of the patient   and by the location of the infection. Symptoms in young women typically include a frequent and intense urge to urinate and a painful, burning feeling in the bladder or urethra during urination. Older women and men are more likely to be tired, shaky, and weak and have muscle aches and abdominal pain. A fever may mean the infection is in your kidneys. Other symptoms of a kidney  infection include pain in your back or sides below the ribs, nausea, and vomiting. DIAGNOSIS To diagnose a UTI, your caregiver will ask you about your symptoms. Your caregiver will also ask you to provide a urine sample. The urine sample will be tested for bacteria and white blood cells. White blood cells are made by your body to help fight infection. TREATMENT  Typically, UTIs can be treated with medication. Because most UTIs are caused by a bacterial infection, they usually can be treated with the use of antibiotics. The choice of antibiotic and length of treatment depend on your symptoms and the type of bacteria causing your infection. HOME CARE INSTRUCTIONS  If you were prescribed antibiotics, take them exactly as your caregiver instructs you. Finish the medication even if you feel better after you have only taken some of the medication.  Drink enough water and fluids to keep your urine clear or pale yellow.  Avoid caffeine, tea, and carbonated beverages. They tend to irritate your bladder.  Empty your bladder often. Avoid holding urine for long periods of time.  Empty your bladder before and after sexual intercourse.  After a bowel movement, women should cleanse from front to back. Use each tissue only once. SEEK MEDICAL CARE IF:   You have back pain.  You develop a fever.  Your symptoms do not begin to resolve within 3 days. SEEK IMMEDIATE MEDICAL CARE IF:   You have severe back pain or lower abdominal pain.  You develop chills.  You have nausea or vomiting.  You have continued burning or discomfort with urination. MAKE SURE YOU:   Understand these instructions.  Will watch your condition.  Will get help right away if you are not doing well or get worse.   This information is not intended to replace advice given to you by your health care provider. Make sure you discuss any questions you have with your health care provider.   Document Released: 07/28/2005 Document  Revised: 07/09/2015 Document Reviewed: 11/26/2011 Elsevier Interactive Patient Education [employer].

## 2016-04-29 NOTE — ED Notes (Signed)
Patient complains of an UTI. Patient states that symptoms started originally 3-4 weeks ago. Patient states that she has finished 2 rounds of antibiotics. Patient states that her symptoms improved and since returned on Tuesday. Patient states that she is having burning with urination, urgency, frequency and decreased output. Patient states that she has a very tender left flank.

## 2016-04-30 LAB — URINE CULTURE
Culture: NO GROWTH
Special Requests: NORMAL

## 2016-08-12 ENCOUNTER — Ambulatory Visit
Admission: EM | Admit: 2016-08-12 | Discharge: 2016-08-12 | Disposition: A | Discharge status: Home / Self Care | Payer: BLUE CROSS/BLUE SHIELD | Attending: Emergency Medicine | Admitting: Emergency Medicine

## 2016-08-12 ENCOUNTER — Ambulatory Visit (INDEPENDENT_AMBULATORY_CARE_PROVIDER_SITE_OTHER): Payer: BLUE CROSS/BLUE SHIELD

## 2016-08-12 ENCOUNTER — Encounter: Payer: Self-pay | Admitting: Emergency Medicine

## 2016-08-12 DIAGNOSIS — S8012XA Contusion of left lower leg, initial encounter: Secondary | ICD-10-CM

## 2016-08-12 DIAGNOSIS — J45901 Unspecified asthma with (acute) exacerbation: Secondary | ICD-10-CM

## 2016-08-12 LAB — FIBRIN DERIVATIVES D-DIMER (ARMC ONLY): FIBRIN DERIVATIVES D-DIMER (ARMC): 400.51 (ref 0–499)

## 2016-08-12 MED ORDER — IPRATROPIUM-ALBUTEROL 0.5-2.5 (3) MG/3ML IN SOLN
3.0000 mL | Freq: Once | RESPIRATORY_TRACT | Status: AC
  Administered 2016-08-12: 3 mL via RESPIRATORY_TRACT

## 2016-08-12 MED ORDER — ALBUTEROL SULFATE HFA 108 (90 BASE) MCG/ACT IN AERS: 1.0000 | INHALATION_SPRAY | RESPIRATORY_TRACT | 0 refills | Status: DC | PRN

## 2016-08-12 MED ORDER — HYDROCOD POLST-CPM POLST ER 10-8 MG/5ML PO SUER: 5.0000 mL | Freq: Two times a day (BID) | ORAL | 0 refills | Status: DC | PRN

## 2016-08-12 MED ORDER — PREDNISONE 50 MG PO TABS
50.0000 mg | ORAL_TABLET | Freq: Every day | ORAL | 0 refills | Status: DC
Start: 2016-08-12 — End: 2017-11-19

## 2016-08-12 MED ORDER — AEROCHAMBER PLUS MISC: 2 refills | Status: DC
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
## 2016-08-12 NOTE — ED Triage Notes (Signed)
Patient c/o SOB that started this morning. Patient reports history of asthma.

## 2016-09-10 ENCOUNTER — Ambulatory Visit
Admission: EM | Admit: 2016-09-10 | Discharge: 2016-09-10 | Disposition: A | Discharge status: Home / Self Care | Payer: BLUE CROSS/BLUE SHIELD | Attending: Family Medicine | Admitting: Family Medicine

## 2016-09-10 ENCOUNTER — Encounter: Payer: Self-pay | Admitting: Emergency Medicine

## 2016-09-10 DIAGNOSIS — B9789 Other viral agents as the cause of diseases classified elsewhere: Secondary | ICD-10-CM

## 2016-09-10 DIAGNOSIS — J029 Acute pharyngitis, unspecified: Secondary | ICD-10-CM

## 2016-09-10 DIAGNOSIS — J069 Acute upper respiratory infection, unspecified: Secondary | ICD-10-CM | POA: Diagnosis not present

## 2016-09-10 LAB — RAPID STREP SCREEN (MED CTR MEBANE ONLY): STREPTOCOCCUS, GROUP A SCREEN (DIRECT): NEGATIVE

## 2016-09-10 MED ORDER — LIDOCAINE VISCOUS 2 % MT SOLN: OROMUCOSAL | 0 refills | Status: DC

## 2016-09-10 NOTE — Discharge Instructions (Signed)
Flonase nasal spray Lots of water Tylenol as needed

## 2016-09-10 NOTE — ED Provider Notes (Signed)
[facility]    CSN: [id number] Arrival date & time: 09/10/16  0908     History   Chief Complaint Chief Complaint  Patient presents with  . Facial Pain  . Nasal Congestion  . Sore Throat    HPI Tiffany Andrade is a 40 y.o. female.   The history is provided by the patient.  Sore Throat   URI  Presenting symptoms: congestion, rhinorrhea and sore throat   Severity:  Moderate Onset quality:  Sudden Duration:  1 day Timing:  Constant Progression:  Unchanged Chronicity:  New Relieved by:  None tried Associated symptoms: no swollen glands and no wheezing   Risk factors: not elderly, no chronic cardiac disease, no chronic kidney disease, no chronic respiratory disease, no diabetes mellitus, no immunosuppression, no recent illness, no recent travel and no sick contacts     Past Medical History:  Diagnosis Date  . Anxiety   . Depression   . DVT (deep venous thrombosis) ([place])   . Hypertension   . UTI (lower urinary tract infection)     There are no active problems to display for this patient.   Past Surgical History:  Procedure Laterality Date  . CESAREAN SECTION     x2  . KIDNEY SURGERY     X2  . TONSILLECTOMY    . TUBAL LIGATION      OB History    No data available       Home Medications    Prior to Admission medications   Medication Sig Start Date End Date Taking? Authorizing Provider  albuterol (PROVENTIL HFA;VENTOLIN HFA) 108 (90 Base) MCG/ACT inhaler Inhale 1-2 puffs into the lungs every 4 (four) hours as needed for wheezing or shortness of breath. 08/12/16   Melynda Ripple, MD  amitriptyline (ELAVIL) 25 MG tablet Take 25 mg by mouth at bedtime as needed for sleep.    Historical Provider, MD  amLODipine (NORVASC) 5 MG tablet Take 5 mg by mouth daily.    Historical Provider, MD  chlorpheniramine-HYDROcodone (TUSSIONEX PENNKINETIC ER) 10-8 MG/5ML SUER Take 5 mLs by mouth every 12 (twelve) hours as needed for cough. 08/12/16   Melynda Ripple, MD  chlorthalidone (HYGROTON) 25 MG tablet Take 12.5 mg by mouth daily.    Historical Provider, MD  FLUoxetine (PROZAC) 20 MG capsule Take 60 mg by mouth daily.    Historical Provider, MD  ibuprofen (ADVIL,MOTRIN) 200 MG tablet Take 800 mg by mouth every 8 (eight) hours as needed for headache, mild pain or moderate pain.    Historical Provider, MD  levonorgestrel (MIRENA) 20 MCG/24HR IUD 20 each by Intrauterine route once. 09/25/13   Historical Provider, MD  lidocaine (XYLOCAINE) 2 % solution 20 ml gargle and spit q 6 hours prn sore throat 09/10/16   Norval Gable, MD  lisinopril-hydrochlorothiazide (PRINZIDE,ZESTORETIC) 20-25 MG tablet Take 1 tablet by mouth daily.    Historical Provider, MD  lithium carbonate (ESKALITH) 450 MG CR tablet Take 450 mg by mouth 3 (three) times daily.    Historical Provider, MD  losartan (COZAAR) 25 MG tablet Take 25 mg by mouth daily.    Historical Provider, MD  metaxalone (SKELAXIN) 800 MG tablet Take 1 tablet (800 mg total) by mouth 3 (three) times daily. 03/07/16   Lorin Picket, PA-C  naproxen (NAPROSYN) 500 MG tablet Take 1 tablet (500 mg total) by mouth 2 (two) times daily with a meal. 03/07/16   Lorin Picket, PA-C  phenazopyridine (PYRIDIUM) 100 MG tablet Take  1 tablet (100 mg total) by mouth 3 (three) times daily as needed for pain. 04/04/16   Andria Meuse, NP  potassium chloride (K-DUR,KLOR-CON) 10 MEQ tablet Take 10 mEq by mouth 2 (two) times daily.    Historical Provider, MD  predniSONE (DELTASONE) 50 MG tablet Take 1 tablet (50 mg total) by mouth daily with breakfast. 08/12/16   Melynda Ripple, MD  QUEtiapine (SEROQUEL) 25 MG tablet Take 100 mg by mouth at bedtime.    Historical Provider, MD  Spacer/Aero-Holding Chambers (AEROCHAMBER PLUS) inhaler Use as instructed 08/12/16   Melynda Ripple, MD  SUMAtriptan (IMITREX) 50 MG tablet Take 50 mg by mouth every 2 (two) hours as needed for migraine. May repeat in 2 hours if headache persists or  recurs.    Historical Provider, MD  traZODone (DESYREL) 100 MG tablet Take 100 mg by mouth at bedtime as needed. For sleep.    Historical Provider, MD    Family History Family History  Problem Relation Age of Onset  . Hypertension Mother   . Hypertension Sister   . Hypertension Brother     Social History Social History  Substance Use Topics  . Smoking status: Never Smoker  . Smokeless tobacco: Never Used  . Alcohol use No     Allergies   Codeine; Ivp dye [iodinated diagnostic agents]; Penicillins; Sulfa antibiotics; and Tape   Review of Systems Review of Systems  HENT: Positive for congestion, rhinorrhea and sore throat.   Respiratory: Negative for wheezing.      Physical Exam Triage Vital Signs ED Triage Vitals  Enc Vitals Group     BP 09/10/16 0955 (!) 143/90     Pulse Rate 09/10/16 0955 93     Resp 09/10/16 0955 16     Temp 09/10/16 0955 97.4 F (36.3 C)     Temp Source 09/10/16 0955 Tympanic     SpO2 09/10/16 0955 99 %     Weight 09/10/16 0953 194 lb (88 kg)     Height 09/10/16 0953 4\' 11"  (1.499 m)     Head Circumference --      Peak Flow --      Pain Score 09/10/16 0954 8     Pain Loc --      Pain Edu? --      Excl. in [place]? --    No data found.   Updated Vital Signs BP (!) 143/90 (BP Location: Right Arm)   Pulse 93   Temp 97.4 F (36.3 C) (Tympanic)   Resp 16   Ht 4\' 11"  (1.499 m)   Wt 194 lb (88 kg)   LMP 08/10/2016 (Exact Date) Comment: tubal and IUD  SpO2 99%   BMI 39.18 kg/m   Visual Acuity Right Eye Distance:   Left Eye Distance:   Bilateral Distance:    Right Eye Near:   Left Eye Near:    Bilateral Near:     Physical Exam  Constitutional: She appears well-developed and well-nourished. No distress.  HENT:  Head: Normocephalic and atraumatic.  Right Ear: Tympanic membrane, external ear and ear canal normal.  Left Ear: Tympanic membrane, external ear and ear canal normal.  Nose: Rhinorrhea present. No mucosal edema, nose  lacerations, sinus tenderness, nasal deformity, septal deviation or nasal septal hematoma. No epistaxis.  No foreign bodies. Right sinus exhibits no maxillary sinus tenderness and no frontal sinus tenderness. Left sinus exhibits no maxillary sinus tenderness and no frontal sinus tenderness.  Mouth/Throat: Uvula is midline and mucous membranes  are normal. Posterior oropharyngeal erythema present. No oropharyngeal exudate, posterior oropharyngeal edema or tonsillar abscesses. No tonsillar exudate.  Eyes: Conjunctivae and EOM are normal. Pupils are equal, round, and reactive to light. Right eye exhibits no discharge. Left eye exhibits no discharge. No scleral icterus.  Neck: Normal range of motion. Neck supple. No thyromegaly present.  Cardiovascular: Normal rate, regular rhythm and normal heart sounds.   Pulmonary/Chest: Effort normal and breath sounds normal. No respiratory distress. She has no wheezes. She has no rales.  Lymphadenopathy:    She has no cervical adenopathy.  Skin: She is not diaphoretic.  Nursing note and vitals reviewed.    UC Treatments / Results  Labs (all labs ordered are listed, but only abnormal results are displayed) Labs Reviewed  RAPID STREP SCREEN (NOT AT [facility])  CULTURE, GROUP A STREP [facility])    EKG  EKG Interpretation None       Radiology No results found.  Procedures Procedures (including critical care time)  Medications Ordered in UC Medications - No data to display   Initial Impression / Assessment and Plan / UC Course  I have reviewed the triage vital signs and the nursing notes.  Pertinent labs & imaging results that were available during my care of the patient were reviewed by me and considered in my medical decision making (see chart for details).  Clinical Course       Final Clinical Impressions(s) / UC Diagnoses   Final diagnoses:  Viral pharyngitis  Viral URI    New Prescriptions Discharge Medication List as of 09/10/2016  11:09 AM    START taking these medications   Details  lidocaine (XYLOCAINE) 2 % solution 20 ml gargle and spit q 6 hours prn sore throat, Normal       1. Lab results and diagnosis reviewed with patient 2. rx as per orders above; reviewed possible side effects, interactions, risks and benefits  3. Recommend supportive treatment with increased fluids, otc analgesics 4. Follow-up prn if symptoms worsen or don't improve   Norval Gable, MD 09/10/16 1130

## 2016-09-10 NOTE — ED Triage Notes (Signed)
Patient c/o sore throat, sinus pain and pressure that started last night.

## 2016-09-13 ENCOUNTER — Telehealth: Payer: Self-pay | Admitting: *Deleted

## 2016-09-13 LAB — CULTURE, GROUP A STREP (THRC)

## 2016-09-15 ENCOUNTER — Telehealth: Payer: Self-pay | Admitting: *Deleted

## 2016-09-15 NOTE — Telephone Encounter (Signed)
Patient returned phone message, verified DOB, communicated negative strep culture result. Patient confirmed understanding of test result.

## 2016-11-14 ENCOUNTER — Encounter: Payer: Self-pay | Admitting: Gynecology

## 2016-11-14 ENCOUNTER — Ambulatory Visit
Admission: EM | Admit: 2016-11-14 | Discharge: 2016-11-14 | Disposition: A | Discharge status: Home / Self Care | Payer: BLUE CROSS/BLUE SHIELD | Attending: Emergency Medicine | Admitting: Emergency Medicine

## 2016-11-14 DIAGNOSIS — G43919 Migraine, unspecified, intractable, without status migrainosus: Secondary | ICD-10-CM

## 2016-11-14 NOTE — ED Provider Notes (Signed)
CSN: [id number]     Arrival date & time 11/14/16  1036 History   First MD Initiated Contact with Patient 11/14/16 1335     Chief Complaint  Patient presents with  . Migraine   (Consider location/radiation/quality/duration/timing/severity/associated sxs/prior Treatment) HPI  This 41 year old female who presents with a migraine headache that started yesterday. She has tried Imitrex and ibuprofen none of which has helped her. She states is a worse headache she's ever had in her life she states that involves the right side of her head on the occipital to midline up to the upper jaw. It is a throbbing quality. She does have some mild photophobia. She states that he keeps worsening. In review of her medical records she was seen at [facility] emergency department on several occasions where she was given narcotics after CT scans and even a lumbar puncture were negative. She states today as she has in the past at [facility] that this her headache is different than usual. He had some nausea; is not currently nauseated. No vomiting. Denies any numbness or tingling any loss of function. She does state that her right vision is blurry.      Past Medical History:  Diagnosis Date  . Anxiety   . Depression   . DVT (deep venous thrombosis) ([place])   . Hypertension   . UTI (lower urinary tract infection)    Past Surgical History:  Procedure Laterality Date  . CESAREAN SECTION     x2  . KIDNEY SURGERY     X2  . TONSILLECTOMY    . TUBAL LIGATION     Family History  Problem Relation Age of Onset  . Hypertension Mother   . Hypertension Sister   . Hypertension Brother    Social History  Substance Use Topics  . Smoking status: Never Smoker  . Smokeless tobacco: Never Used  . Alcohol use No   OB History    No data available     Review of Systems  Constitutional: Positive for activity change. Negative for chills, fatigue and fever.  Neurological: Positive for dizziness and headaches. Negative for  tremors, seizures, syncope, facial asymmetry, speech difficulty, weakness and numbness.  All other systems reviewed and are negative.   Allergies  Codeine; Ivp dye [iodinated diagnostic agents]; Penicillins; Sulfa antibiotics; and Tape  Home Medications   Prior to Admission medications   Medication Sig Start Date End Date Taking? Authorizing Provider  albuterol (PROVENTIL HFA;VENTOLIN HFA) 108 (90 Base) MCG/ACT inhaler Inhale 1-2 puffs into the lungs every 4 (four) hours as needed for wheezing or shortness of breath. 08/12/16  Yes Melynda Ripple, MD  amitriptyline (ELAVIL) 25 MG tablet Take 25 mg by mouth at bedtime as needed for sleep.   Yes Historical Provider, MD  amLODipine (NORVASC) 5 MG tablet Take 5 mg by mouth daily.   Yes Historical Provider, MD  chlorpheniramine-HYDROcodone (TUSSIONEX PENNKINETIC ER) 10-8 MG/5ML SUER Take 5 mLs by mouth every 12 (twelve) hours as needed for cough. 08/12/16  Yes Melynda Ripple, MD  chlorthalidone (HYGROTON) 25 MG tablet Take 12.5 mg by mouth daily.   Yes Historical Provider, MD  FLUoxetine (PROZAC) 20 MG capsule Take 60 mg by mouth daily.   Yes Historical Provider, MD  ibuprofen (ADVIL,MOTRIN) 200 MG tablet Take 800 mg by mouth every 8 (eight) hours as needed for headache, mild pain or moderate pain.   Yes Historical Provider, MD  levonorgestrel (MIRENA) 20 MCG/24HR IUD 20 each by Intrauterine route once. 09/25/13  Yes Historical Provider,  MD  lidocaine (XYLOCAINE) 2 % solution 20 ml gargle and spit q 6 hours prn sore throat 09/10/16  Yes Norval Gable, MD  lisinopril-hydrochlorothiazide (PRINZIDE,ZESTORETIC) 20-25 MG tablet Take 1 tablet by mouth daily.   Yes Historical Provider, MD  lithium carbonate (ESKALITH) 450 MG CR tablet Take 450 mg by mouth 3 (three) times daily.   Yes Historical Provider, MD  losartan (COZAAR) 25 MG tablet Take 25 mg by mouth daily.   Yes Historical Provider, MD  metaxalone (SKELAXIN) 800 MG tablet Take 1 tablet (800 mg  total) by mouth 3 (three) times daily. 03/07/16  Yes Lorin Picket, PA-C  naproxen (NAPROSYN) 500 MG tablet Take 1 tablet (500 mg total) by mouth 2 (two) times daily with a meal. 03/07/16  Yes Lorin Picket, PA-C  potassium chloride (K-DUR,KLOR-CON) 10 MEQ tablet Take 10 mEq by mouth 2 (two) times daily.   Yes Historical Provider, MD  QUEtiapine (SEROQUEL) 25 MG tablet Take 100 mg by mouth at bedtime.   Yes Historical Provider, MD  Spacer/Aero-Holding Chambers (AEROCHAMBER PLUS) inhaler Use as instructed 08/12/16  Yes Melynda Ripple, MD  SUMAtriptan (IMITREX) 50 MG tablet Take 50 mg by mouth every 2 (two) hours as needed for migraine. May repeat in 2 hours if headache persists or recurs.   Yes Historical Provider, MD  traZODone (DESYREL) 100 MG tablet Take 100 mg by mouth at bedtime as needed. For sleep.   Yes Historical Provider, MD  phenazopyridine (PYRIDIUM) 100 MG tablet Take 1 tablet (100 mg total) by mouth 3 (three) times daily as needed for pain. 04/04/16   Andria Meuse, NP  predniSONE (DELTASONE) 50 MG tablet Take 1 tablet (50 mg total) by mouth daily with breakfast. 08/12/16   Melynda Ripple, MD   Meds Ordered and Administered this Visit  Medications - No data to display  BP (!) 161/110 (BP Location: Left Arm)   Pulse 92   Temp 98 F (36.7 C) (Oral)   Resp 18   Ht 4\' 11"  (1.499 m)   Wt 198 lb (89.8 kg)   LMP 10/25/2016   SpO2 99%   BMI 39.99 kg/m  No data found.   Physical Exam  Constitutional: She is oriented to person, place, and time. She appears well-developed and well-nourished. No distress.  HENT:  Head: Normocephalic and atraumatic.  Right Ear: External ear normal.  Left Ear: External ear normal.  Nose: Nose normal.  Mouth/Throat: Oropharynx is clear and moist. No oropharyngeal exudate.  Eyes: EOM are normal. Pupils are equal, round, and reactive to light. Right eye exhibits no discharge. Left eye exhibits no discharge.  Neck: Normal range of motion. Neck  supple.  Lymphadenopathy:    She has no cervical adenopathy.  Neurological: She is alert and oriented to person, place, and time. She displays normal reflexes. No cranial nerve deficit or sensory deficit. She exhibits normal muscle tone. Coordination normal.  Skin: Skin is warm and dry. She is not diaphoretic.  Psychiatric: She has a normal mood and affect. Her behavior is normal. Judgment and thought content normal.  Nursing note and vitals reviewed.   Urgent Care Course   Clinical Course     Procedures (including critical care time)  Labs Review Labs Reviewed - No data to display  Imaging Review No results found.   Visual Acuity Review  Right Eye Distance:   Left Eye Distance:   Bilateral Distance:    Right Eye Near:   Left Eye Near:  Bilateral Near:         MDM   1. Intractable migraine without status migrainosus, unspecified migraine type    I explained to the patient and her friend that her headache being the worst of her life that's been intractable to all medication she is taken with a previous history of migraine require further evaluation and treatment at a higher facility. Will transport her to an emergency room for treatment by privately owned vehicle. The patient was stable to time of her discharge.    Lorin Picket, PA-C 11/14/16 1407

## 2016-11-14 NOTE — ED Triage Notes (Signed)
Per patient migraine x yesterday. Per patient has hx of migraine and is taking medication for her migraine but migraine worse x yesterday.

## 2017-04-06 DIAGNOSIS — R339 Retention of urine, unspecified: Secondary | ICD-10-CM | POA: Insufficient documentation

## 2017-04-06 DIAGNOSIS — N3020 Other chronic cystitis without hematuria: Secondary | ICD-10-CM | POA: Insufficient documentation

## 2017-04-06 DIAGNOSIS — R3129 Other microscopic hematuria: Secondary | ICD-10-CM | POA: Insufficient documentation

## 2017-04-06 DIAGNOSIS — N2889 Other specified disorders of kidney and ureter: Secondary | ICD-10-CM | POA: Insufficient documentation

## 2017-04-06 DIAGNOSIS — N1370 Vesicoureteral-reflux, unspecified: Secondary | ICD-10-CM | POA: Insufficient documentation

## 2017-06-03 DIAGNOSIS — G43009 Migraine without aura, not intractable, without status migrainosus: Secondary | ICD-10-CM | POA: Insufficient documentation

## 2017-11-13 IMAGING — CR DG CERVICAL SPINE COMPLETE 4+V
6 series · 6 of 6 positions shown · non-contrast
Comparison: None.

CLINICAL DATA: Acute right cervical spine pain following fall
today. Initial encounter.

EXAM:
CERVICAL SPINE - COMPLETE 4+ VIEW

[c-spine lat]
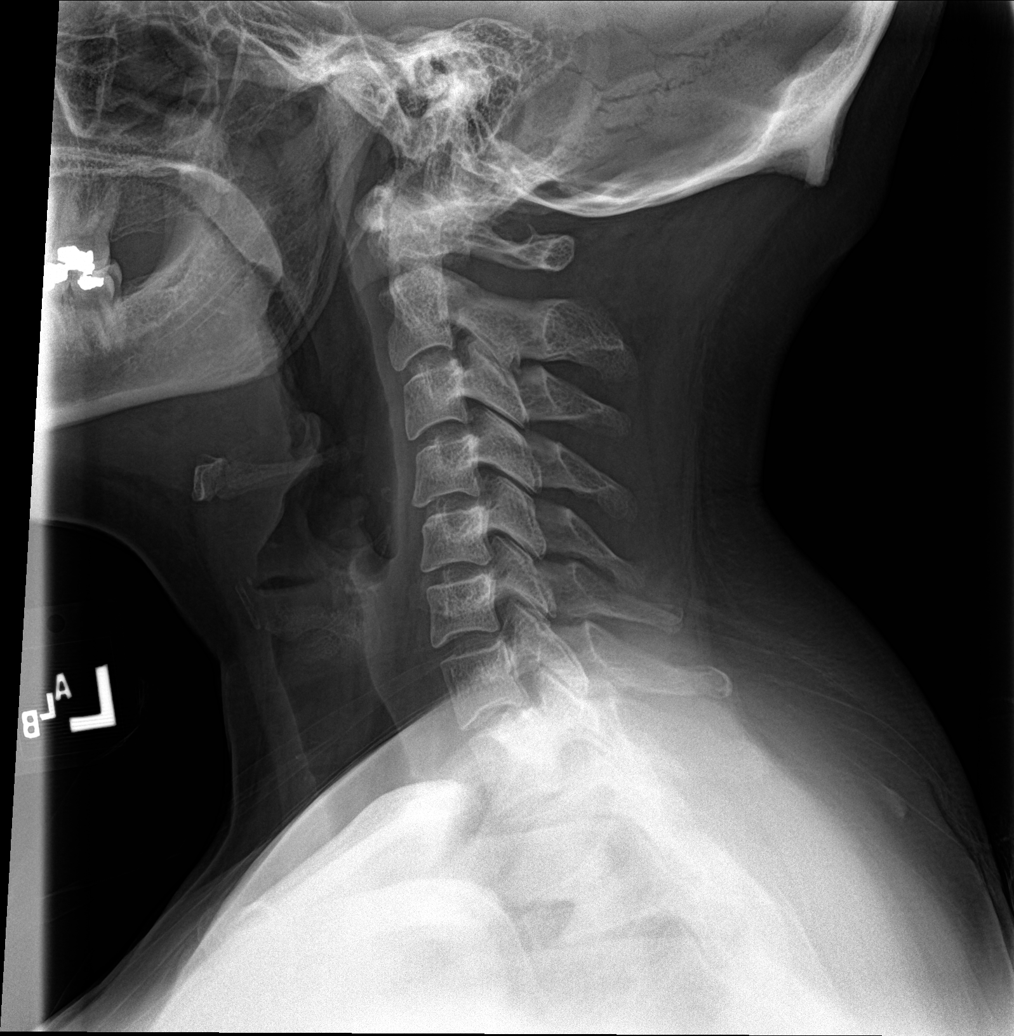

[c-spine obl (1 of 2)]
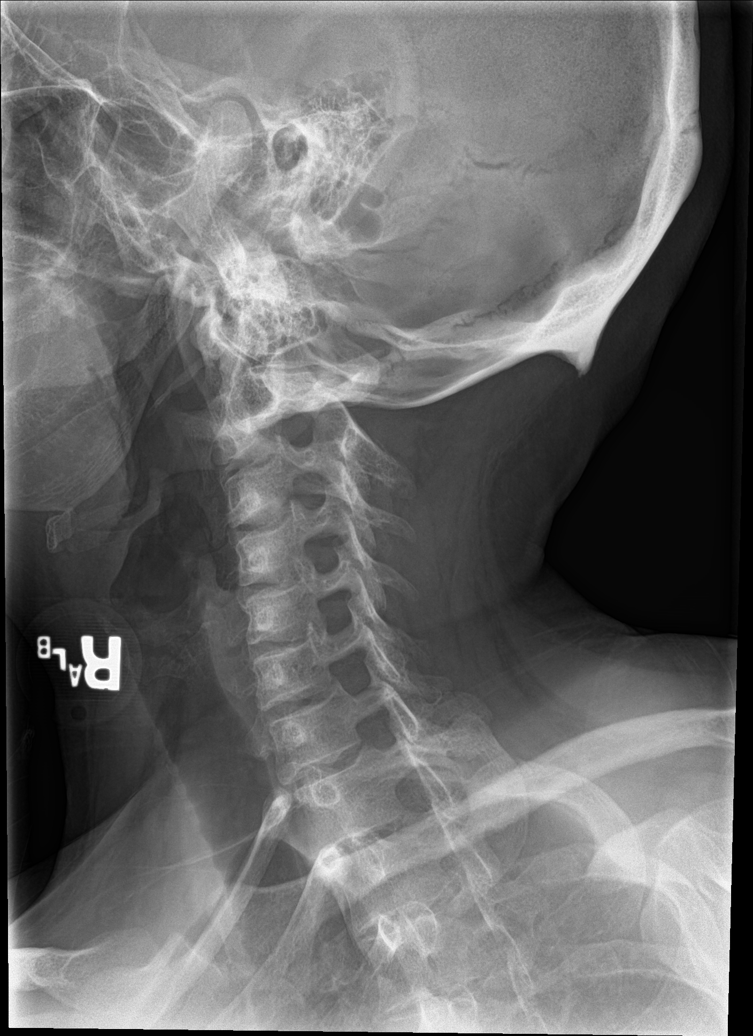

[c-spine obl (2 of 2)]
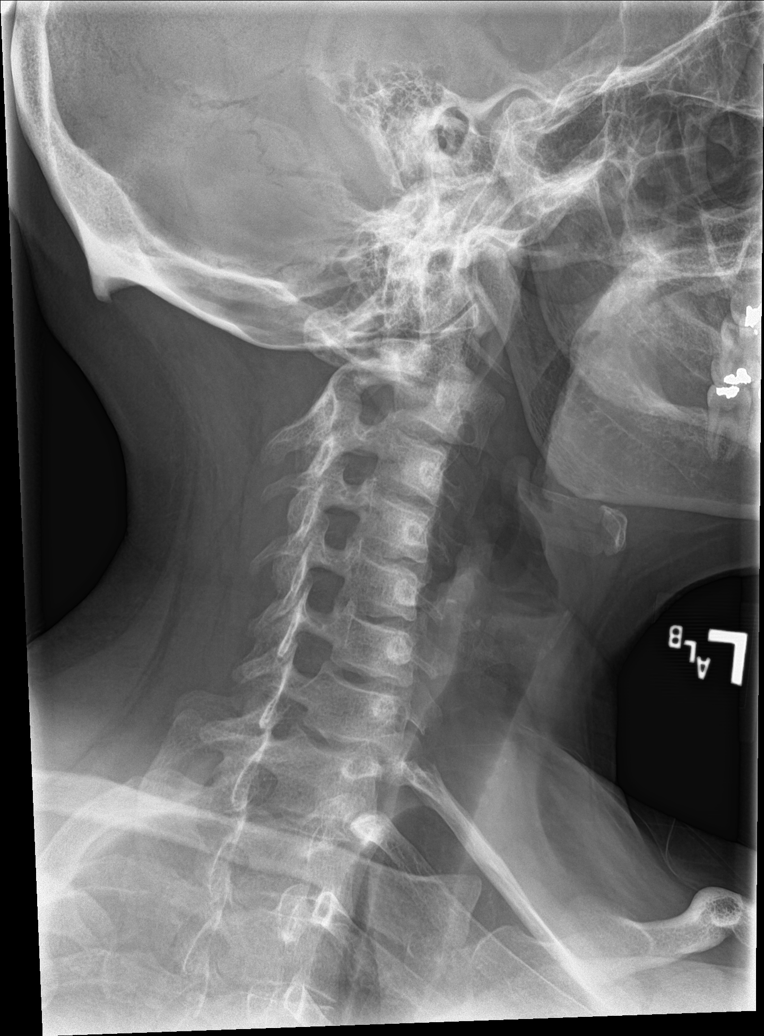

[c-spine ap]
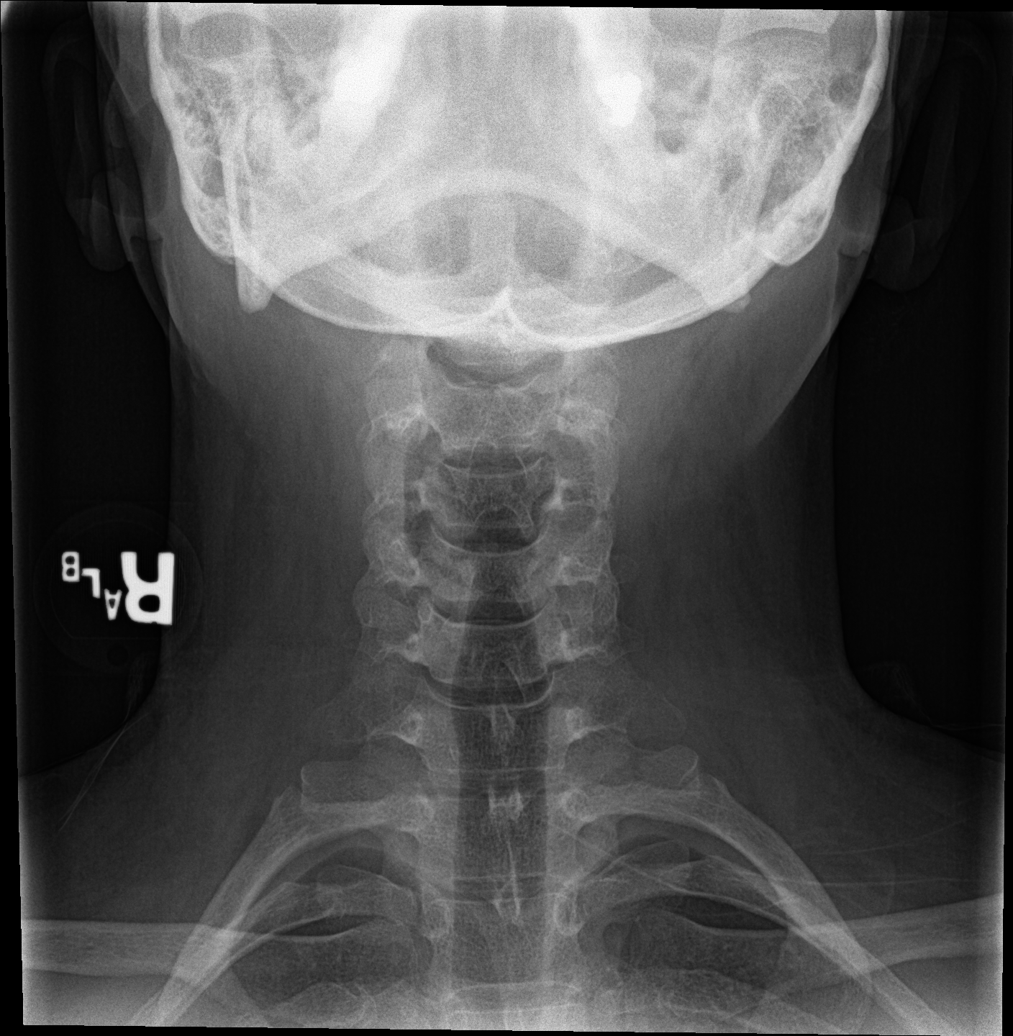

[c-spine open mouth (1 of 2)]
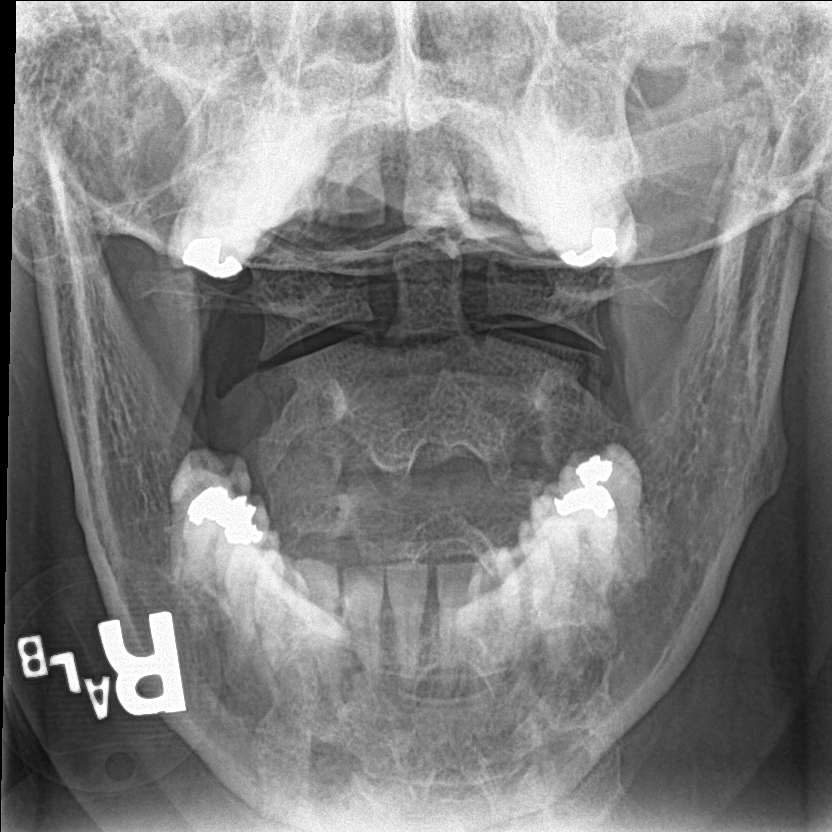

[c-spine open mouth (2 of 2)]
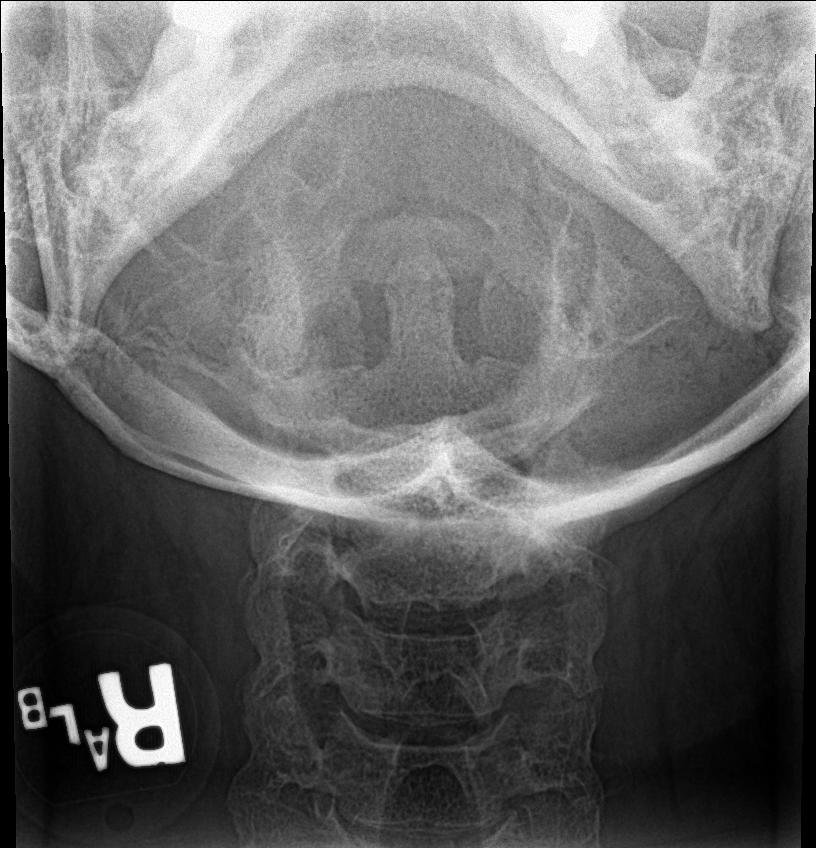

[6 of 6 positions shown; findings below may reference images not displayed]

FINDINGS: There is no evidence of cervical spine fracture or prevertebral soft
tissue swelling. Alignment is normal. No other significant bone
abnormalities are identified.
IMPRESSION: Negative cervical spine radiographs.

## 2017-11-19 ENCOUNTER — Ambulatory Visit
Admission: EM | Admit: 2017-11-19 | Discharge: 2017-11-19 | Disposition: A | Discharge status: Home / Self Care | Payer: 59 | Attending: Family Medicine | Admitting: Family Medicine

## 2017-11-19 ENCOUNTER — Encounter: Payer: Self-pay | Admitting: *Deleted

## 2017-11-19 DIAGNOSIS — J0110 Acute frontal sinusitis, unspecified: Secondary | ICD-10-CM

## 2017-11-19 DIAGNOSIS — R059 Cough, unspecified: Secondary | ICD-10-CM

## 2017-11-19 DIAGNOSIS — H9201 Otalgia, right ear: Secondary | ICD-10-CM | POA: Diagnosis not present

## 2017-11-19 DIAGNOSIS — R05 Cough: Secondary | ICD-10-CM

## 2017-11-19 DIAGNOSIS — J0100 Acute maxillary sinusitis, unspecified: Secondary | ICD-10-CM

## 2017-11-19 DIAGNOSIS — J029 Acute pharyngitis, unspecified: Secondary | ICD-10-CM

## 2017-11-19 LAB — RAPID STREP SCREEN (MED CTR MEBANE ONLY): Streptococcus, Group A Screen (Direct): NEGATIVE

## 2017-11-19 MED ORDER — BENZONATATE 100 MG PO CAPS: 100.0000 mg | ORAL_CAPSULE | Freq: Three times a day (TID) | ORAL | 0 refills | Status: DC | PRN

## 2017-11-19 MED ORDER — AMOXICILLIN-POT CLAVULANATE 875-125 MG PO TABS: 1.0000 | ORAL_TABLET | Freq: Two times a day (BID) | ORAL | 0 refills | Status: DC

## 2017-11-19 NOTE — ED Triage Notes (Signed)
Right ear pain, sore throat, and right facial pain for several days. Recent URI and had esophageal US done Tuesday.

## 2017-11-19 NOTE — ED Provider Notes (Signed)
[facility] ____________________________________________  Time seen: Approximately 1:00 PM  I have reviewed the triage vital signs and the nursing notes.   HISTORY  Chief Complaint Otalgia and Sore Throat   HPI Tiffany Andrade is a 42 y.o. female presenting for evaluation of nasal congestion, sinus pressure, cough, sore throat and right ear pain.  Patient reports that middle of last week she had quick onset of cough, congestion, chills, body aches and a fever of 101.  States the symptoms lasted for a few days.  States fever has since resolved, but reports congestion and cough has continued.  Patient reports that fever had resolved and she was already scheduled for an upper endoscopy with ultrasound to evaluate for any NF tumors, and reports that she continued with her endoscopy.  Patient states for the first 2 days after the endoscopy she was having a sensation of swelling a golf ball and sore throat, but reports that has since resolved.  States that now she has with continued sinus congestion, sinus pressure, cough and right ear discomfort.  States that she feel like there is fluid in her right ear.  States that she was taken over-the-counter cough and congestion agents, but reports that she knows it was causing her blood pressure to go up so she stopped and symptoms have continued.  Denies any fevers in the last few days.  Reports that she is eating and drinking fine.  Denies difficulty with swallowing.  States that she does have some pain to right ear when swallowing.  States that similar congestion sensation to prior sinus involvement.  States cough continues and is a overall nonproductive hacking cough.  States initially she was able to get a lot of nasal drainage out, but the last few days it is more of a clogged sensation.  Denies other aggravating or alleviating factors.  Reports otherwise feels well.  Denies recent antibiotic use. Denies chest pain, shortness of breath,  abdominal pain, or rash. Denies recent sickness. Denies recent antibiotic use.   [facility], [place]: PCP   Past Medical History:  Diagnosis Date  . Anxiety   . Depression   . DVT (deep venous thrombosis) ([place])   . Hypertension   . UTI (lower urinary tract infection)   Neurofibroma  There are no active problems to display for this patient.   Past Surgical History:  Procedure Laterality Date  . CESAREAN SECTION     x2  . KIDNEY SURGERY     X2  . TONSILLECTOMY    . TUBAL LIGATION       No current facility-administered medications for this encounter.   Current Outpatient Medications:  .  amitriptyline (ELAVIL) 25 MG tablet, Take 25 mg by mouth at bedtime as needed for sleep., Disp: , Rfl:  .  amLODipine (NORVASC) 5 MG tablet, Take 5 mg by mouth daily., Disp: , Rfl:  .  celecoxib (CELEBREX) 100 MG capsule, Take 100 mg by mouth 2 (two) times daily., Disp: , Rfl:  .  chlorthalidone (HYGROTON) 25 MG tablet, Take 12.5 mg by mouth daily., Disp: , Rfl:  .  ibuprofen (ADVIL,MOTRIN) 200 MG tablet, Take 800 mg by mouth every 8 (eight) hours as needed for headache, mild pain or moderate pain., Disp: , Rfl:  .  lisinopril-hydrochlorothiazide (PRINZIDE,ZESTORETIC) 20-25 MG tablet, Take 1 tablet by mouth daily., Disp: , Rfl:  .  potassium chloride (K-DUR,KLOR-CON) 10 MEQ tablet, Take 10 mEq by mouth 2 (two) times daily., Disp: , Rfl:  .  pregabalin (LYRICA) 75 MG capsule, Take 75 mg by mouth 3 (three) times daily., Disp: , Rfl:  .  amoxicillin-clavulanate (AUGMENTIN) 875-125 MG tablet, Take 1 tablet by mouth every 12 (twelve) hours., Disp: 20 tablet, Rfl: 0 .  benzonatate (TESSALON PERLES) 100 MG capsule, Take 1 capsule (100 mg total) by mouth 3 (three) times daily as needed for cough., Disp: 15 capsule, Rfl: 0 .  Spacer/Aero-Holding Chambers (AEROCHAMBER PLUS) inhaler, Use as instructed, Disp: 1 each, Rfl: 2  Allergies Codeine; Ivp dye [iodinated diagnostic agents]; Sulfa  antibiotics; and Tape  Family History  Problem Relation Age of Onset  . Hypertension Mother   . Hypertension Sister   . Hypertension Brother     Social History Social History   Tobacco Use  . Smoking status: Never Smoker  . Smokeless tobacco: Never Used  Substance Use Topics  . Alcohol use: No    Alcohol/week: 0.0 oz  . Drug use: No    Review of Systems Constitutional: No fever/chills ENT: As above.  Cardiovascular: Denies chest pain. Respiratory: Denies shortness of breath. Gastrointestinal: No abdominal pain.  No nausea, no vomiting.  No diarrhea.  No constipation. Genitourinary: Negative for dysuria. Musculoskeletal: Negative for back pain. Skin: Negative for rash.   ____________________________________________   PHYSICAL EXAM:  VITAL SIGNS: ED Triage Vitals  Enc Vitals Group     BP 11/19/17 1228 (!) 161/109     Pulse Rate 11/19/17 1228 74     Resp 11/19/17 1228 16     Temp 11/19/17 1228 97.6 F (36.4 C)     Temp Source 11/19/17 1228 Oral     SpO2 11/19/17 1228 100 %     Weight 11/19/17 1232 200 lb (90.7 kg)     Height 11/19/17 1232 4\' 11"  (1.499 m)     Head Circumference --      Peak Flow --      Pain Score --      Pain Loc --      Pain Edu? --      Excl. in [place]? --     Constitutional: Alert and oriented. Well appearing and in no acute distress. Eyes: Conjunctivae are normal.  Head: Atraumatic.Mild to moderate tenderness to palpation bilateral frontal and moderate tenderness to right maxillary sinus and mild tenderness to left maxillary sinuses. No swelling. No erythema.   Ears: Left: Nontender, normal canal, no erythema, normal TM.  Right: Nontender, normal canal, no erythema, mild effusion, otherwise normal TM.  No mastoid tenderness bilaterally.  Nose: nasal congestion with bilateral nasal turbinate erythema and edema.   Mouth/Throat: Mucous membranes are moist.  Oropharynx non-erythematous.No tonsillar swelling or exudate.  No oral lesions  noted. Neck: No stridor.  No cervical spine tenderness to palpation. Hematological/Lymphatic/Immunilogical: No cervical lymphadenopathy.  No clear mass palpated. Cardiovascular: Normal rate, regular rhythm. Grossly normal heart sounds.  Good peripheral circulation. Respiratory: Normal respiratory effort.  No retractions. No wheezes, rales or rhonchi. Good air movement.  Occasional dry intermittent cough noted in room.  Speaks in complete sentences. Musculoskeletal: Steady gait.   Neurologic:  Normal speech and language.No gait instability. Skin:  Skin is warm, dry and intact. No rash noted. Psychiatric: Mood and affect are normal. Speech and behavior are normal.  ___________________________________________   LABS (all labs ordered are listed, but only abnormal results are displayed)  Labs Reviewed  RAPID STREP SCREEN (NOT AT [facility])  CULTURE, GROUP A STREP [facility])   ____________________________________________   PROCEDURES Procedures    INITIAL IMPRESSION /  ASSESSMENT AND PLAN / ED COURSE  Pertinent labs & imaging results that were available during my care of the patient were reviewed by me and considered in my medical decision making (see chart for details).  Well-appearing patient.  No acute distress.  Suspect patient may have had viral illness such as influenza last week.  Concern now for secondary sinusitis.  Right ear effusion noted mild.  Patient swallowing well and no pharyngeal irritation noted.  Encouraged to continue follow-up with GI.  As concern for secondary sinusitis will empirically start patient on oral Augmentin and PRN Tessalon Perles.  Encourage rest, fluids, supportive care.  Discussed strict follow-up and return parameters.  Discussed follow up with Primary care physician this week. Discussed follow up and return parameters including no resolution or any worsening concerns. Patient verbalized understanding and agreed to plan.    ____________________________________________   FINAL CLINICAL IMPRESSION(S) / ED DIAGNOSES  Final diagnoses:  Acute maxillary sinusitis, recurrence not specified  Acute frontal sinusitis, recurrence not specified  Acute otalgia, right  Sore throat  Cough     ED Discharge Orders        Ordered    amoxicillin-clavulanate (AUGMENTIN) 875-125 MG tablet  Every 12 hours     11/19/17 1306    benzonatate (TESSALON PERLES) 100 MG capsule  3 times daily PRN     11/19/17 1306       Note: This dictation was prepared with Dragon dictation along with smaller phrase technology. Any transcriptional errors that result from this process are unintentional.         Marylene Land, NP 11/19/17 1320

## 2017-11-19 NOTE — Discharge Instructions (Signed)
Take medication as prescribed. Rest. Drink plenty of fluids.  ° °Follow up with your primary care physician this week as needed. Return to Urgent care for new or worsening concerns.  ° °

## 2017-11-22 LAB — CULTURE, GROUP A STREP (THRC)

## 2018-04-14 DIAGNOSIS — M79604 Pain in right leg: Secondary | ICD-10-CM | POA: Insufficient documentation

## 2018-04-14 DIAGNOSIS — D3610 Benign neoplasm of peripheral nerves and autonomic nervous system, unspecified: Secondary | ICD-10-CM | POA: Insufficient documentation

## 2018-04-20 IMAGING — CR DG CHEST 2V
2 series · 2 of 2 positions shown · non-contrast
Comparison: 01/22/2016 and earlier.

CLINICAL DATA: 40-year-old female with shortness of Breath since
this morning. Initial encounter.

EXAM:
CHEST  2 VIEW

[chest pa]
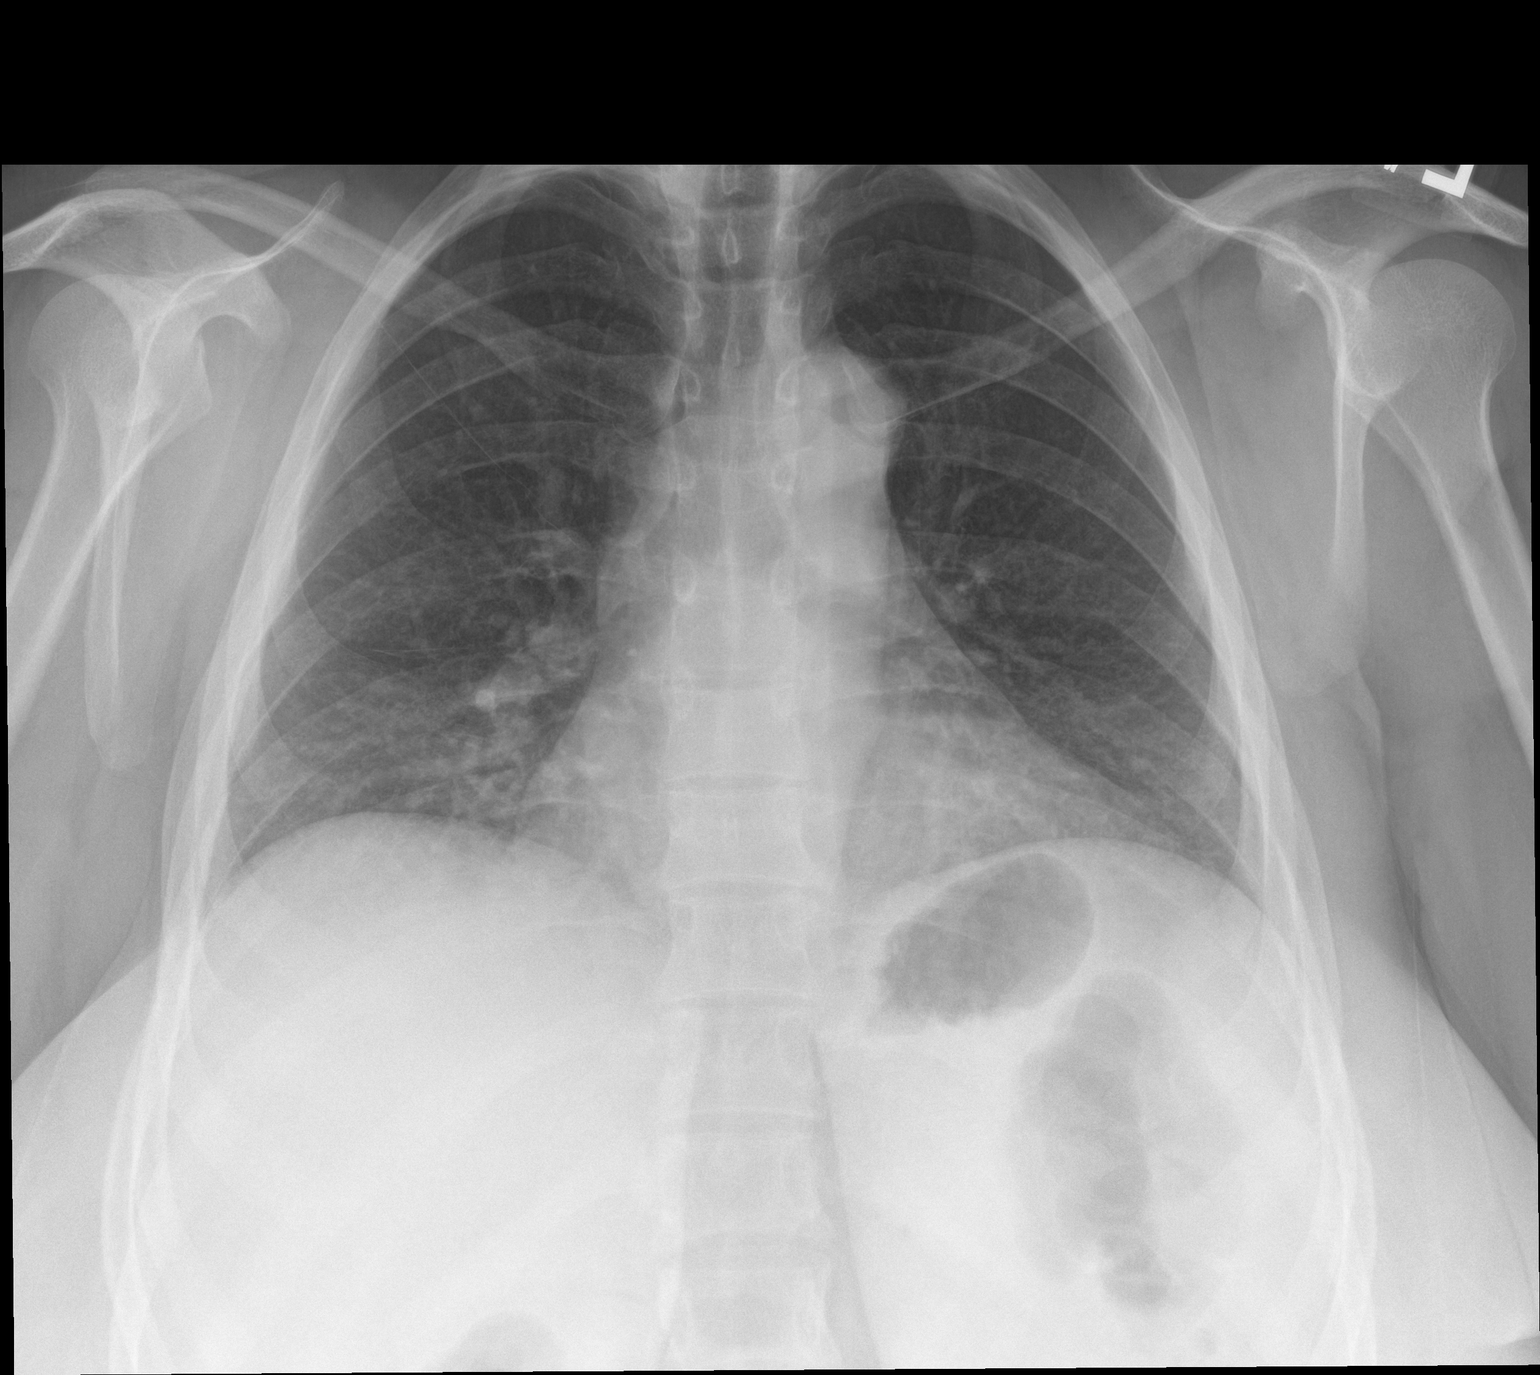

[chest lat]
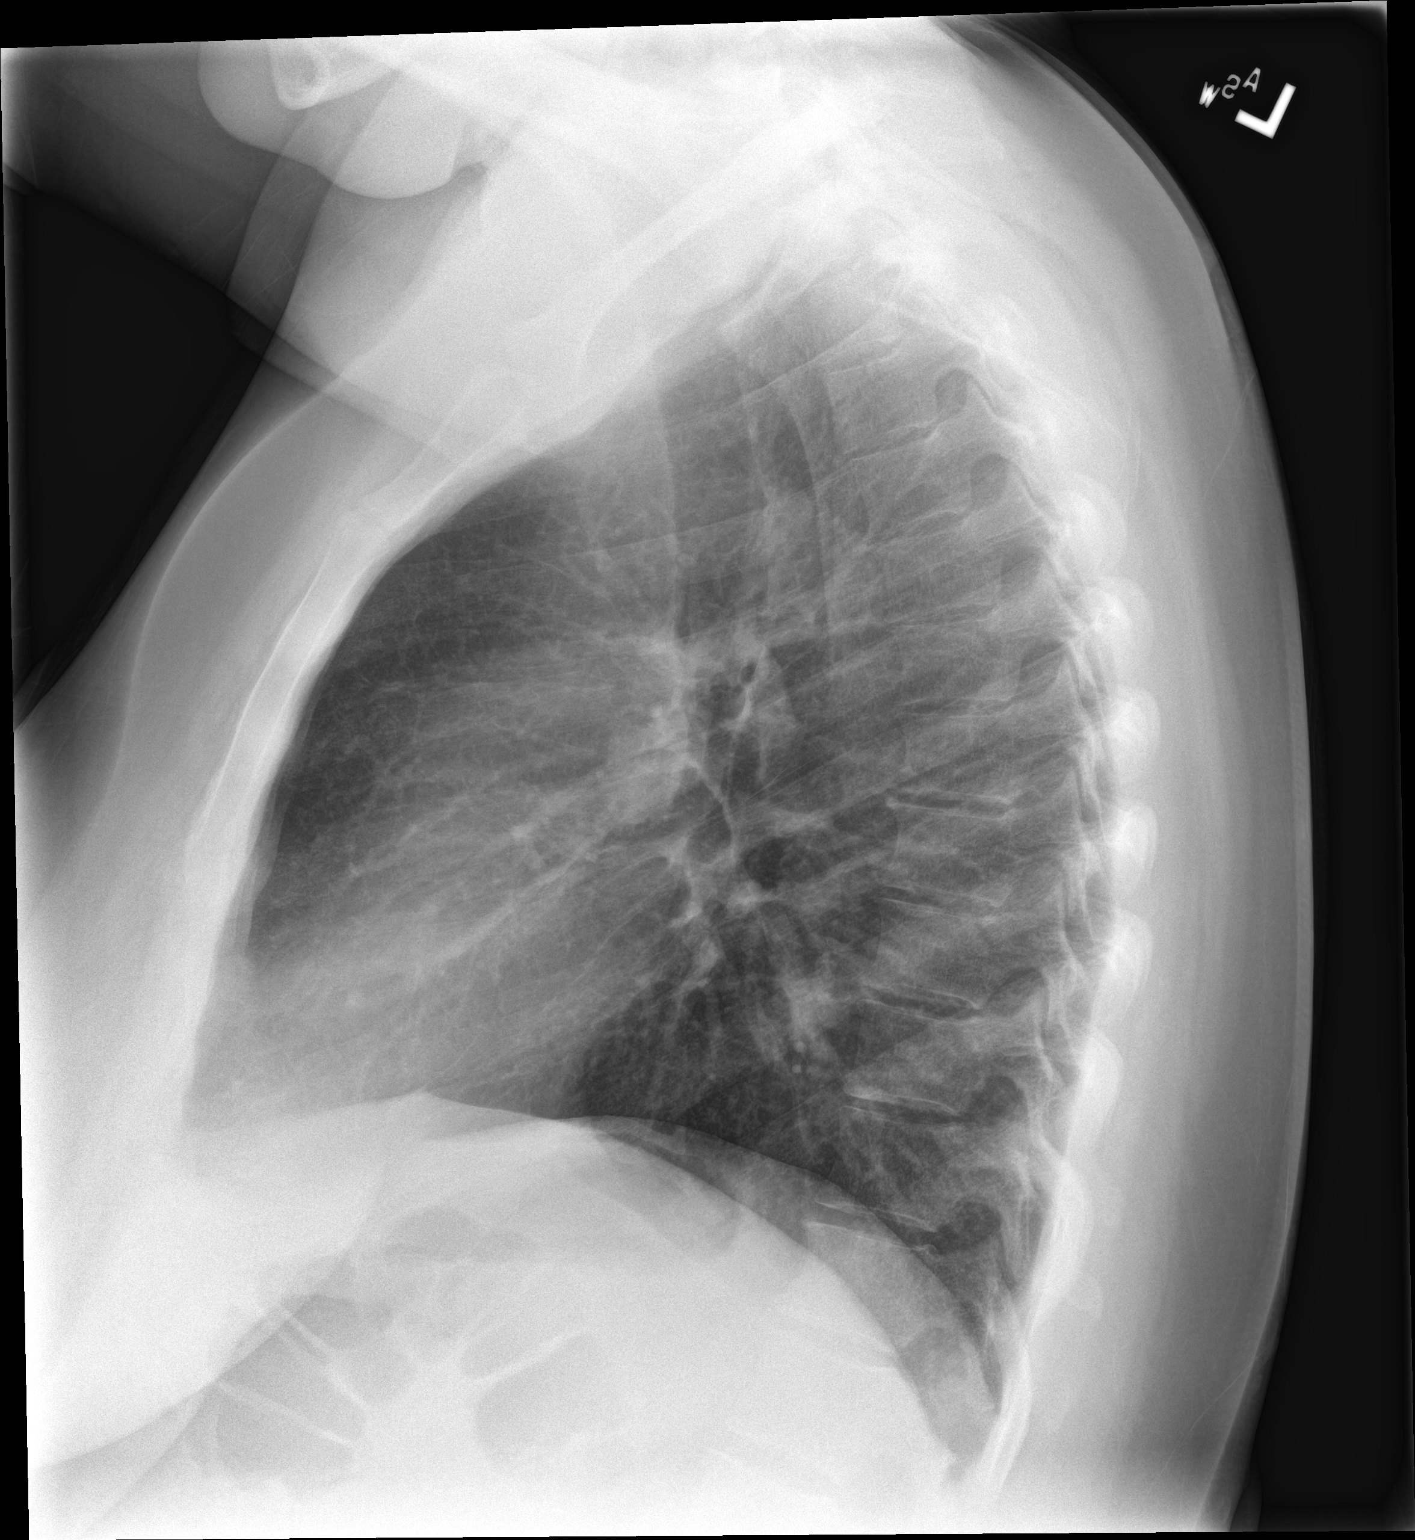

[2 of 2 positions shown; findings below may reference images not displayed]

FINDINGS: Lower lung volumes on the frontal view with basilar crowding of lung
markings. On the lateral view lung volumes appear stable an the lung
bases appear better ventilated. Normal cardiac size and mediastinal
contours. Visualized tracheal air column is within normal limits. No
pneumothorax or pulmonary edema. No pleural effusion or
consolidation. No acute osseous abnormality identified. Negative
visible bowel gas pattern.
IMPRESSION: Lower lung volumes, otherwise no acute cardiopulmonary abnormality.

## 2018-05-25 DIAGNOSIS — R079 Chest pain, unspecified: Secondary | ICD-10-CM | POA: Insufficient documentation

## 2018-08-18 ENCOUNTER — Ambulatory Visit
Admission: EM | Admit: 2018-08-18 | Discharge: 2018-08-18 | Disposition: A | Discharge status: Home / Self Care | Payer: 59 | Attending: Emergency Medicine | Admitting: Emergency Medicine

## 2018-08-18 ENCOUNTER — Other Ambulatory Visit: Payer: Self-pay

## 2018-08-18 DIAGNOSIS — B373 Candidiasis of vulva and vagina: Secondary | ICD-10-CM | POA: Diagnosis not present

## 2018-08-18 DIAGNOSIS — R1031 Right lower quadrant pain: Secondary | ICD-10-CM

## 2018-08-18 DIAGNOSIS — B3731 Acute candidiasis of vulva and vagina: Secondary | ICD-10-CM

## 2018-08-18 LAB — URINALYSIS, COMPLETE (UACMP) WITH MICROSCOPIC
BILIRUBIN URINE: NEGATIVE
Bacteria, UA: NONE SEEN
Glucose, UA: NEGATIVE mg/dL
HGB URINE DIPSTICK: NEGATIVE
Ketones, ur: NEGATIVE mg/dL
LEUKOCYTES UA: NEGATIVE
NITRITE: NEGATIVE
PH: 5.5 (ref 5.0–8.0)
Protein, ur: NEGATIVE mg/dL
RBC / HPF: NONE SEEN RBC/hpf (ref 0–5)
SPECIFIC GRAVITY, URINE: 1.02 (ref 1.005–1.030)

## 2018-08-18 LAB — WET PREP, GENITAL
CLUE CELLS WET PREP: NONE SEEN
Sperm: NONE SEEN
TRICH WET PREP: NONE SEEN

## 2018-08-18 MED ORDER — FLUCONAZOLE 150 MG PO TABS: 150.0000 mg | ORAL_TABLET | Freq: Once | ORAL | 1 refills | Status: AC

## 2018-08-18 NOTE — Discharge Instructions (Addendum)
Go immediately to the [facility] emergency department.  Do not have anything to eat or drink until your ER evaluation is complete.  I am concerned about appendicitis versus a ruptured ovarian cyst.  Your urine is negative for urinary tract infection and there is no blood in it suggestive of kidney stones.  Let them know if your pain gets worse, changes, or for other concerns.  Take the Diflucan today and you may repeat it in 72 hours if you are still having symptoms.  You may continue the Monistat external cream.  Sitz baths when urinating.

## 2018-08-18 NOTE — ED Provider Notes (Signed)
HPI  SUBJECTIVE:  Tiffany Andrade is a 42 y.o. female who presents with 2 complaints.  First, she recently finished a course of antibiotics for a sinus infection and reports vaginal itching, burning, labial swelling, "rawness" the past week.  She reports seeing some blood when she wipes.  She reports odor.  She reports dysuria.  No urinary urgency, frequency, cloudy odorous urine, hematuria.  No genital rash.  She is in a long-term monogamous relationship with a female, who is asymptomatic, STDs are not a concern today.  She tried a 3-day course of OTC Monistat which helped her symptoms for a day.  Symptoms are worse with wearing clothes, urination.    Second, she reports right lower quadrant pain, right low back pain, anorexia starting today.  She reports the pain as throbbing, constant, occasionally radiating down her thigh.  No fevers, vomiting, distention.  Symptoms are better with bending forward, sitting still, putting pressure on the area, worse with walking, standing up straight, torso rotation.  She states that the car ride over here was painful.  She has had similar pain before when she has had UTI/nephrolithiasis.  She has a past medical history of UTI, pyelonephritis, nephrolithiasis, HSV, yeast vaginitis, hypertension, ovarian cysts.  No history of gonorrhea, chlamydia, HIV, syphilis, Trichomonas, BV, PID, ectopic pregnancy, PCOS, ovarian torsion.  She is status post 2 C-sections and a hysterectomy.  States that her ovaries remain.  PMD:Inc, [employer]   Past Medical History:  Diagnosis Date  . Anxiety   . Depression   . DVT (deep venous thrombosis) ([place])   . Hypertension   . UTI (lower urinary tract infection)     Past Surgical History:  Procedure Laterality Date  . CESAREAN SECTION     x2  . KIDNEY SURGERY     X2  . TONSILLECTOMY    . TUBAL LIGATION      Family History  Problem Relation Age of Onset  . Hypertension Mother   . Hypertension Sister   .  Hypertension Brother     Social History   Tobacco Use  . Smoking status: Never Smoker  . Smokeless tobacco: Never Used  Substance Use Topics  . Alcohol use: No    Alcohol/week: 0.0 standard drinks  . Drug use: No    No current facility-administered medications for this encounter.   Current Outpatient Medications:  .  amitriptyline (ELAVIL) 25 MG tablet, Take 25 mg by mouth at bedtime as needed for sleep., Disp: , Rfl:  .  amLODipine (NORVASC) 5 MG tablet, Take 5 mg by mouth daily., Disp: , Rfl:  .  chlorthalidone (HYGROTON) 25 MG tablet, Take 12.5 mg by mouth daily., Disp: , Rfl:  .  ibuprofen (ADVIL,MOTRIN) 200 MG tablet, Take 800 mg by mouth every 8 (eight) hours as needed for headache, mild pain or moderate pain., Disp: , Rfl:  .  lisinopril-hydrochlorothiazide (PRINZIDE,ZESTORETIC) 20-25 MG tablet, Take 1 tablet by mouth daily., Disp: , Rfl:  .  potassium chloride (K-DUR,KLOR-CON) 10 MEQ tablet, Take 10 mEq by mouth 2 (two) times daily., Disp: , Rfl:  .  rizatriptan (MAXALT-MLT) 10 MG disintegrating tablet, Give one tablet may repeat in 2 hours if needed.  No more than 4 in 1 week, Disp: , Rfl:  .  sertraline (ZOLOFT) 25 MG tablet, TAKE 1 TABLET BY MOUTH ONCE DAILY FOR 1 WEEK. IF TOLERATING WELL WITH NO SIDE EFFECTS MAY INCREASE TO 2 TABLETS BY MOUTH DAILY., Disp: , Rfl: 0 .  Spacer/Aero-Holding Chambers (AEROCHAMBER PLUS) inhaler, Use as instructed, Disp: 1 each, Rfl: 2 .  fluconazole (DIFLUCAN) 150 MG tablet, Take 1 tablet (150 mg total) by mouth once for 1 dose. 1 tab po x 1. May repeat in 72 hours if no improvement, Disp: 2 tablet, Rfl: 1  Allergies  Allergen Reactions  . Codeine Other (See Comments)    Patient states she gets real bad headaches.  Clementeen Hoof [Iodinated Diagnostic Agents] Hives and Other (See Comments)    Patient states her tongue starts tingling.  . Sulfa Antibiotics Other (See Comments)    Patient states this medication does not work for her, have to take  stronger dose for a longer period of time.  . Tape Itching and Other (See Comments)    blisters     ROS  As noted in HPI.   Physical Exam  BP (!) 136/93 (BP Location: Right Arm)   Pulse 74   Temp 98.2 F (36.8 C) (Oral)   Resp 18   Ht 4\' 11"  (1.499 m)   Wt 89.8 kg   LMP 10/25/2016   SpO2 98%   BMI 39.99 kg/m   Constitutional: Well developed, well nourished, no acute distress Eyes:  EOMI, conjunctiva normal bilaterally HENT: Normocephalic, atraumatic,mucus membranes moist Respiratory: Normal inspiratory effort Cardiovascular: Normal rate GI: Normal appearance, soft, nondistended, active bowel sounds.  Positive McBurney, positive Rovsing, positive tap table test.  Positive rebound.  No guarding.  Mild suprapubic tenderness.  Mild left flank tenderness. Back: No CVAT GU: Deferred skin: No rash, skin intact Musculoskeletal: no deformities Neurologic: Alert & oriented x 3, no focal neuro deficits Psychiatric: Speech and behavior appropriate   ED Course   Medications - No data to display  Orders Placed This Encounter  Procedures  . Wet prep, genital    Standing Status:   Standing    Number of Occurrences:   1  . Urinalysis, Complete w Microscopic    Standing Status:   Standing    Number of Occurrences:   1    Results for orders placed or performed during the hospital encounter of 08/18/18 (from the past 24 hour(s))  Urinalysis, Complete w Microscopic     Status: None   Collection Time: 08/18/18  9:02 AM  Result Value Ref Range   Color, Urine YELLOW YELLOW   APPearance CLEAR CLEAR   Specific Gravity, Urine 1.020 1.005 - 1.030   pH 5.5 5.0 - 8.0   Glucose, UA NEGATIVE NEGATIVE mg/dL   Hgb urine dipstick NEGATIVE NEGATIVE   Bilirubin Urine NEGATIVE NEGATIVE   Ketones, ur NEGATIVE NEGATIVE mg/dL   Protein, ur NEGATIVE NEGATIVE mg/dL   Nitrite NEGATIVE NEGATIVE   Leukocytes, UA NEGATIVE NEGATIVE   Squamous Epithelial / LPF 11-20 0 - 5   WBC, UA 0-5 0 - 5  WBC/hpf   RBC / HPF NONE SEEN 0 - 5 RBC/hpf   Bacteria, UA NONE SEEN NONE SEEN   Budding Yeast PRESENT   Wet prep, genital     Status: Abnormal   Collection Time: 08/18/18  9:15 AM  Result Value Ref Range   Yeast Wet Prep HPF POC PRESENT (A) NONE SEEN   Trich, Wet Prep NONE SEEN NONE SEEN   Clue Cells Wet Prep HPF POC NONE SEEN NONE SEEN   WBC, Wet Prep HPF POC FEW (A) NONE SEEN   Sperm NONE SEEN    No results found.  ED Clinical Impression  Right lower quadrant abdominal pain  Yeast  vaginitis   ED Assessment/Plan  1.  Yeast vaginitis.  Wet prep but positive for yeast, otherwise negative.   Sending home with Diflucan x2.  2.  Right lower quadrant pain.  Patient has peritoneal signs.  Concern for appendicitis versus ruptured ovarian cyst.  torsion is low in the differential.  PID lower in the differential as she is status post hysterectomy and not high risk for STIs.  UA negative for UTI, hematuria.  Do not think that her abdominal pain is from a UTI, nephrolithiasis.  Transferring to the [facility] emergency department for comprehensive evaluation.  Feel that the patient is stable to go via private vehicle.  Buys her to be n.p.o. until your ER evaluation is complete.  Discussed labs, MDM, treatment plan, and rationale for transfer to the [facility] ER with patient.. patient agrees with plan.   Meds ordered this encounter  Medications  . fluconazole (DIFLUCAN) 150 MG tablet    Sig: Take 1 tablet (150 mg total) by mouth once for 1 dose. 1 tab po x 1. May repeat in 72 hours if no improvement    Dispense:  2 tablet    Refill:  1    *This clinic note was created using Lobbyist. Therefore, there may be occasional mistakes despite careful proofreading.   ?   Melynda Ripple, MD 08/18/18 [phone]

## 2018-08-18 NOTE — ED Triage Notes (Addendum)
Patient complains of vaginal itching, blood when wiping at times, lower abdominal pain. States that she has used 3 day treatment of monistat. States that this has been ongoing x 7 days.

## 2019-11-14 ENCOUNTER — Other Ambulatory Visit: Payer: Self-pay

## 2019-11-14 ENCOUNTER — Ambulatory Visit
Admission: EM | Admit: 2019-11-14 | Discharge: 2019-11-14 | Disposition: A | Discharge status: Home / Self Care | Payer: BC Managed Care – PPO | Attending: Emergency Medicine | Admitting: Emergency Medicine

## 2019-11-14 ENCOUNTER — Encounter: Payer: Self-pay | Admitting: Emergency Medicine

## 2019-11-14 DIAGNOSIS — J0140 Acute pansinusitis, unspecified: Secondary | ICD-10-CM | POA: Diagnosis present

## 2019-11-14 DIAGNOSIS — Z20822 Contact with and (suspected) exposure to covid-19: Secondary | ICD-10-CM

## 2019-11-14 DIAGNOSIS — U071 COVID-19: Secondary | ICD-10-CM | POA: Insufficient documentation

## 2019-11-14 MED ORDER — DOXYCYCLINE HYCLATE 100 MG PO CAPS: 100.0000 mg | ORAL_CAPSULE | Freq: Two times a day (BID) | ORAL | 0 refills | Status: AC

## 2019-11-14 MED ORDER — HYDROCOD POLST-CPM POLST ER 10-8 MG/5ML PO SUER: 5.0000 mL | Freq: Two times a day (BID) | ORAL | 0 refills | Status: DC | PRN

## 2019-11-14 MED ORDER — BENZONATATE 200 MG PO CAPS: 200.0000 mg | ORAL_CAPSULE | Freq: Three times a day (TID) | ORAL | 0 refills | Status: DC | PRN

## 2019-11-14 MED ORDER — AEROCHAMBER PLUS MISC: 2 refills | Status: DC

## 2019-11-14 NOTE — Discharge Instructions (Addendum)
Continue Flonase, Mucinex.  Start saline nasal irrigation with a Milta Deiters med rinse and distilled water as often as you want.  Finish the doxycycline, even if you feel better.  2 puffs from your albuterol inhaler every 4 hours using a spacer.  Tessalon for the cough during the day, Tussionex for the cough at night.  Your Covid test will take anywhere from 18 to 48 hours to come back.

## 2019-11-14 NOTE — ED Provider Notes (Signed)
HPI  SUBJECTIVE:  Tiffany Andrade is a 44 y.o. female who presents with URI symptoms for 2-1/2 weeks.  She is not getting worse, but is not getting any better.  She is also here for Covid testing so that she can go back to work.  Her son, who lives with her, tested +1 week ago.  She reports fevers to 100.2, nasal congestion, cough, sinus pain and pressure, postnasal drip, occasional wheezing.  States that she is unable to sleep at night secondary to the cough.  No fevers, body aches, headaches, fatigue, loss of sense of smell or taste, nausea, vomiting, diarrhea, abdominal pain.  Shortness of breath with coughing only.  No antipyretic in the past 4 to 6 hours.  She has been taking Mucinex, Alka-Seltzer cold, albuterol and Flonase.  The albuterol helps.  She does not have a spacer for this.  No aggravating factors.  She has a past medical history of MS, migraines, asthma, hypertension, ureteral reflux, she is on CPAP.  She has a history of DVT, remote, not on any anticoagulants or antiplatelets.  No history of diabetes, smoking, coronary disease, HIV, immunocompromise, cancer.  PMD:Inc, [employer]   Past Medical History:  Diagnosis Date  . Anxiety   . Depression   . DVT (deep venous thrombosis) ([place])   . Hypertension   . UTI (lower urinary tract infection)     Past Surgical History:  Procedure Laterality Date  . CESAREAN SECTION     x2  . KIDNEY SURGERY     X2  . TONSILLECTOMY    . TUBAL LIGATION      Family History  Problem Relation Age of Onset  . Hypertension Mother   . Hypertension Sister   . Hypertension Brother     Social History   Tobacco Use  . Smoking status: Never Smoker  . Smokeless tobacco: Never Used  Substance Use Topics  . Alcohol use: No    Alcohol/week: 0.0 standard drinks  . Drug use: No    No current facility-administered medications for this encounter.  Current Outpatient Medications:  .  albuterol (VENTOLIN HFA) 108 (90 Base) MCG/ACT  inhaler, Inhale into the lungs., Disp: , Rfl:  .  amLODipine (NORVASC) 5 MG tablet, Take 5 mg by mouth daily., Disp: , Rfl:  .  busPIRone (BUSPAR) 10 MG tablet, Take 10 mg by mouth 2 (two) times daily., Disp: , Rfl:  .  chlorthalidone (HYGROTON) 25 MG tablet, Take 12.5 mg by mouth daily., Disp: , Rfl:  .  gabapentin (NEURONTIN) 300 MG capsule, Take 3 capsules by mouth 3 (three) times daily., Disp: , Rfl:  .  hydrOXYzine (ATARAX/VISTARIL) 25 MG tablet, Take 25 mg by mouth every 8 (eight) hours as needed., Disp: , Rfl:  .  losartan (COZAAR) 25 MG tablet, Take 25 mg by mouth daily., Disp: , Rfl:  .  MAGNESIUM PO, Take by mouth., Disp: , Rfl:  .  meloxicam (MOBIC) 15 MG tablet, Take 15 mg by mouth 2 (two) times daily., Disp: , Rfl:  .  potassium chloride (K-DUR,KLOR-CON) 10 MEQ tablet, Take 10 mEq by mouth 2 (two) times daily., Disp: , Rfl:  .  POTASSIUM PO, Take by mouth., Disp: , Rfl:  .  sertraline (ZOLOFT) 25 MG tablet, TAKE 1 TABLET BY MOUTH ONCE DAILY FOR 1 WEEK. IF TOLERATING WELL WITH NO SIDE EFFECTS MAY INCREASE TO 2 TABLETS BY MOUTH DAILY., Disp: , Rfl: 0 .  traZODone (DESYREL) 50 MG tablet, prn, Disp: ,  Rfl:  .  benzonatate (TESSALON) 200 MG capsule, Take 1 capsule (200 mg total) by mouth 3 (three) times daily as needed for cough., Disp: 30 capsule, Rfl: 0 .  chlorpheniramine-HYDROcodone (TUSSIONEX PENNKINETIC ER) 10-8 MG/5ML SUER, Take 5 mLs by mouth every 12 (twelve) hours as needed for cough., Disp: 60 mL, Rfl: 0 .  doxycycline (VIBRAMYCIN) 100 MG capsule, Take 1 capsule (100 mg total) by mouth 2 (two) times daily for 7 days., Disp: 14 capsule, Rfl: 0 .  lisinopril-hydrochlorothiazide (PRINZIDE,ZESTORETIC) 20-25 MG tablet, Take 1 tablet by mouth daily., Disp: , Rfl:  .  rizatriptan (MAXALT-MLT) 10 MG disintegrating tablet, Give one tablet may repeat in 2 hours if needed.  No more than 4 in 1 week, Disp: , Rfl:  .  Spacer/Aero-Holding Chambers (AEROCHAMBER PLUS) inhaler, Use as instructed,  Disp: 1 each, Rfl: 2  Allergies  Allergen Reactions  . Codeine Other (See Comments)    Patient states she gets real bad headaches.  Clementeen Hoof [Iodinated Diagnostic Agents] Hives and Other (See Comments)    Patient states her tongue starts tingling.  . Sulfa Antibiotics Other (See Comments)    Patient states this medication does not work for her, have to take stronger dose for a longer period of time.  . Tape Itching and Other (See Comments)    blisters     ROS  As noted in HPI.   Physical Exam  BP (!) 124/106 (BP Location: Left Arm)   Pulse 84   Temp 98.1 F (36.7 C) (Oral)   Resp 18   Ht 4\' 11"  (1.499 m)   Wt 94.8 kg   LMP 10/25/2016   SpO2 100%   BMI 42.21 kg/m   Constitutional: Well developed, well nourished, no acute distress Eyes:  EOMI, conjunctiva normal bilaterally HENT: Normocephalic, atraumatic,mucus membranes moist, erythematous, swollen turbinates with purulent nasal congestion.  Maxillary and frontal sinus tenderness.  No obvious postnasal drip. Respiratory: Normal inspiratory effort, lungs clear bilaterally.  Positive anterior chest wall tenderness Cardiovascular: Normal rate regular rhythm no murmurs rubs or gallops GI: nondistended skin: No rash, skin intact Musculoskeletal: no deformities Neurologic: Alert & oriented x 3, no focal neuro deficits Psychiatric: Speech and behavior appropriate   ED Course   Medications - No data to display  Orders Placed This Encounter  Procedures  . Novel Coronavirus, NAA (Hosp order, Send-out to Ref Lab; TAT 18-24 hrs    Standing Status:   Standing    Number of Occurrences:   1    Order Specific Question:   Is this test for diagnosis or screening    Answer:   Diagnosis of ill patient    Order Specific Question:   Symptomatic for COVID-19 as defined by CDC    Answer:   Yes    Order Specific Question:   Date of Symptom Onset    Answer:   10/31/2019    Order Specific Question:   Hospitalized for COVID-19     Answer:   No    Order Specific Question:   Admitted to ICU for COVID-19    Answer:   No    Order Specific Question:   Previously tested for COVID-19    Answer:   No    Order Specific Question:   Resident in a congregate (group) care setting    Answer:   No    Order Specific Question:   Employed in healthcare setting    Answer:   No    Order  Specific Question:   Pregnant    Answer:   No    No results found for this or any previous visit (from the past 24 hour(s)). No results found.  ED Clinical Impression  1. Acute non-recurrent pansinusitis   2. Encounter for laboratory testing for COVID-19 virus      ED Assessment/Plan  [place] Narcotic database reviewed for this patient, and feel that the risk/benefit ratio today is favorable for proceeding with a prescription for controlled substance.  Patient with sinusitis.  Sending home with doxycycline due to duration of symptoms.  She is to continue Flonase, Mucinex, start saline nasal irrigation.  Will have her spacer for her albuterol inhaler.  States that she does not need refill on her albuterol.  Tessalon, Tussionex.  Covid PCR sent.  Follow-up with PMD as needed, to the ER if she gets worse.  Discussed  MDM, treatment plan, and plan for follow-up with patient. Discussed sn/sx that should prompt return to the ED. patient agrees with plan.   Meds ordered this encounter  Medications  . Spacer/Aero-Holding Chambers (AEROCHAMBER PLUS) inhaler    Sig: Use as instructed    Dispense:  1 each    Refill:  2  . chlorpheniramine-HYDROcodone (TUSSIONEX PENNKINETIC ER) 10-8 MG/5ML SUER    Sig: Take 5 mLs by mouth every 12 (twelve) hours as needed for cough.    Dispense:  60 mL    Refill:  0  . benzonatate (TESSALON) 200 MG capsule    Sig: Take 1 capsule (200 mg total) by mouth 3 (three) times daily as needed for cough.    Dispense:  30 capsule    Refill:  0  . doxycycline (VIBRAMYCIN) 100 MG capsule    Sig: Take 1 capsule (100 mg total) by  mouth 2 (two) times daily for 7 days.    Dispense:  14 capsule    Refill:  0    *This clinic note was created using Lobbyist. Therefore, there may be occasional mistakes despite careful proofreading.   ?    Melynda Ripple, MD 11/14/19 1109

## 2019-11-14 NOTE — ED Triage Notes (Signed)
Patient in today c/o cough and runny nose x 2.5 weeks. Patient had positive covid exposure last week.

## 2019-11-15 ENCOUNTER — Telehealth: Payer: Self-pay | Admitting: Emergency Medicine

## 2019-11-15 LAB — NOVEL CORONAVIRUS, NAA (HOSP ORDER, SEND-OUT TO REF LAB; TAT 18-24 HRS): SARS-CoV-2, NAA: DETECTED — AB

## 2019-11-15 NOTE — Telephone Encounter (Signed)
Called patient to notify of positive covid results. Advised patient to quarantine for 10 days from the start of symptoms and fever free for 3 days without fever and no new respiratory symptoms. Patient voiced understanding.

## 2019-11-17 ENCOUNTER — Telehealth: Payer: Self-pay | Admitting: Unknown Physician Specialty

## 2019-11-17 NOTE — Telephone Encounter (Signed)
Called to discuss with patient about Covid symptoms and the use of bamlanivimab, a monoclonal antibody infusion for those with mild to moderate Covid symptoms and at a high risk of hospitalization.  Pt is not qualified for this infusion due to symptoms ongoing for 2 1/2 weeks and is outside of the window for beneficial mab effect.

## 2019-12-06 ENCOUNTER — Other Ambulatory Visit: Payer: Self-pay

## 2019-12-06 ENCOUNTER — Ambulatory Visit
Admission: EM | Admit: 2019-12-06 | Discharge: 2019-12-06 | Disposition: A | Discharge status: Home / Self Care | Payer: BC Managed Care – PPO

## 2019-12-06 DIAGNOSIS — R030 Elevated blood-pressure reading, without diagnosis of hypertension: Secondary | ICD-10-CM

## 2019-12-06 DIAGNOSIS — R05 Cough: Secondary | ICD-10-CM | POA: Diagnosis not present

## 2019-12-06 DIAGNOSIS — Z7689 Persons encountering health services in other specified circumstances: Secondary | ICD-10-CM

## 2019-12-06 DIAGNOSIS — R059 Cough, unspecified: Secondary | ICD-10-CM

## 2019-12-06 DIAGNOSIS — Z8616 Personal history of COVID-19: Secondary | ICD-10-CM

## 2019-12-06 NOTE — Discharge Instructions (Signed)
It was very nice seeing you today in clinic. Thank you for entrusting me with your care.   Continue prednisone and Tessalon. You may have cough for weeks to months after your COVID diagnosis. May return to work.   Make arrangements to follow up with your regular doctor in 1 week for re-evaluation if not improving. If your symptoms/condition worsens, please seek follow up care either here or in the ER. Please remember, our [place] providers are "right here with you" when you need [place].   Again, it was my pleasure to take care of you today. Thank you for choosing our clinic. I hope that you start to feel better quickly.   Honor Loh, MSN, APRN, FNP-C, CEN Advanced Practice Provider [facility]

## 2019-12-06 NOTE — ED Triage Notes (Signed)
Pt presents with c/o recent COVID positive (11/14/19). She has continued dry cough and runny nose. She was seen in [facility] 12/03/19 and given oral prednisone and Tessalon, she reports no improvement in her symptoms. She denies fever/chills, shob, wheezing or other symptoms.
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
## 2020-02-15 DIAGNOSIS — G8929 Other chronic pain: Secondary | ICD-10-CM | POA: Insufficient documentation

## 2020-06-03 ENCOUNTER — Ambulatory Visit
Admission: EM | Admit: 2020-06-03 | Discharge: 2020-06-03 | Disposition: A | Discharge status: Home / Self Care | Payer: BC Managed Care – PPO | Attending: Family Medicine | Admitting: Family Medicine

## 2020-06-03 ENCOUNTER — Other Ambulatory Visit: Payer: Self-pay

## 2020-06-03 DIAGNOSIS — N390 Urinary tract infection, site not specified: Secondary | ICD-10-CM

## 2020-06-03 DIAGNOSIS — R319 Hematuria, unspecified: Secondary | ICD-10-CM | POA: Diagnosis present

## 2020-06-03 LAB — URINALYSIS, COMPLETE (UACMP) WITH MICROSCOPIC
Bilirubin Urine: NEGATIVE
Glucose, UA: NEGATIVE mg/dL
Ketones, ur: NEGATIVE mg/dL
Nitrite: POSITIVE — AB
Protein, ur: >300 mg/dL — AB
Specific Gravity, Urine: 1.025 (ref 1.005–1.030)
WBC, UA: >50 WBC/hpf (ref 0–5)
pH: 5.5 (ref 5.0–8.0)

## 2020-06-03 MED ORDER — CEFDINIR 300 MG PO CAPS: 300.0000 mg | ORAL_CAPSULE | Freq: Two times a day (BID) | ORAL | 0 refills | Status: DC

## 2020-06-03 NOTE — ED Triage Notes (Signed)
Patient presents w/ c/o UTI sx. Patient states pain when voiding, pressure in LLQ, and nausea. Patient denies fever and vomiting.

## 2020-06-03 NOTE — ED Provider Notes (Signed)
[facility]    CSN: [id number] Arrival date & time: 06/03/20  1610      History   Chief Complaint Chief Complaint  Patient presents with  . Urinary Tract Infection   HPI   44 year old female presents with suspected UTI.  Patient reports that her symptoms started this morning.  She reports dysuria as well as pressure in her abdomen.  Worse when urinating.  Associated nausea.  No vomiting.  No fever.  She reports left flank pain as well as low back pain.  Pain 9/10 in severity.  No relieving factors.  Patient believes that she has UTI.  She has history UTI.  No other associated symptoms.  No other complaints.  Past Medical History:  Diagnosis Date  . Anxiety   . Depression   . DVT (deep venous thrombosis) ([place])   . Hypertension   . UTI (lower urinary tract infection)    Past Surgical History:  Procedure Laterality Date  . CESAREAN SECTION     x2  . KIDNEY SURGERY     X2  . TONSILLECTOMY    . TUBAL LIGATION     OB History   No obstetric history on file.    Home Medications    Prior to Admission medications   Medication Sig Start Date End Date Taking? Authorizing Provider  albuterol (VENTOLIN HFA) 108 (90 Base) MCG/ACT inhaler Inhale into the lungs.   Yes [provider]  amLODipine (NORVASC) 5 MG tablet Take 5 mg by mouth daily.   Yes [provider]  busPIRone (BUSPAR) 10 MG tablet Take 10 mg by mouth 2 (two) times daily. 11/09/19  Yes [provider]  chlorthalidone (HYGROTON) 25 MG tablet Take 12.5 mg by mouth daily.   Yes [provider]  hydrOXYzine (ATARAX/VISTARIL) 25 MG tablet Take 25 mg by mouth every 8 (eight) hours as needed. 10/12/19  Yes [provider]  lisinopril-hydrochlorothiazide (PRINZIDE,ZESTORETIC) 20-25 MG tablet Take 1 tablet by mouth daily.   Yes [provider]  losartan (COZAAR) 25 MG tablet Take 25 mg by mouth daily. 09/21/19  Yes [provider]  MAGNESIUM PO Take by  mouth.   Yes [provider]  meloxicam (MOBIC) 15 MG tablet Take 15 mg by mouth 2 (two) times daily. 09/14/19  Yes [provider]  potassium chloride (K-DUR,KLOR-CON) 10 MEQ tablet Take 10 mEq by mouth 2 (two) times daily.   Yes [provider]  rizatriptan (MAXALT-MLT) 10 MG disintegrating tablet Give one tablet may repeat in 2 hours if needed.  No more than 4 in 1 week 04/14/18  Yes [provider]  sertraline (ZOLOFT) 25 MG tablet TAKE 1 TABLET BY MOUTH ONCE DAILY FOR 1 WEEK. IF TOLERATING WELL WITH NO SIDE EFFECTS MAY INCREASE TO 2 TABLETS BY MOUTH DAILY. 08/11/18  Yes [provider]  Spacer/Aero-Holding Chambers (AEROCHAMBER PLUS) inhaler Use as instructed 11/14/19  Yes Melynda Ripple, MD  topiramate (TOPAMAX) 25 MG tablet Please start at 25mg  nightly for a week. You may increase to 25mg  twice a day a week later if you are tolerating this well. You can increase to three times per day a week later if you are still tolerating this well. 05/01/20  Yes [provider]  traZODone (DESYREL) 50 MG tablet prn 10/17/19  Yes [provider]  B Complex-C-Calcium (GNP B-COMPLEX PLUS VITAMIN C) TABS Take by mouth.    [provider]  cefdinir (OMNICEF) 300 MG capsule Take 1 capsule (300 mg  total) by mouth 2 (two) times daily. 06/03/20   Coral Spikes, DO  cyclobenzaprine (FLEXERIL) 10 MG tablet Take 10 mg by mouth at bedtime as needed. 12/23/19   [provider]  fluticasone (FLONASE) 50 MCG/ACT nasal spray Place 1 spray into both nostrils daily. 05/20/20   [provider]  gabapentin (NEURONTIN) 300 MG capsule Take 3 capsules by mouth 3 (three) times daily. 12/15/18 12/15/19  [provider]  nystatin ointment (MYCOSTATIN) Apply topically.    [provider]  Olopatadine HCl 0.2 % SOLN Apply 1 drop to eye daily. 05/12/20   [provider]  POTASSIUM PO Take by mouth.    [provider]   amitriptyline (ELAVIL) 25 MG tablet Take 25 mg by mouth at bedtime as needed for sleep.  11/14/19  [provider]    Family History Family History  Problem Relation Age of Onset  . Hypertension Mother   . Hypertension Sister   . Hypertension Brother     Social History Social History   Tobacco Use  . Smoking status: Never Smoker  . Smokeless tobacco: Never Used  Vaping Use  . Vaping Use: Never used  Substance Use Topics  . Alcohol use: No    Alcohol/week: 0.0 standard drinks  . Drug use: No     Allergies   Codeine, Ivp dye [iodinated diagnostic agents], Sulfa antibiotics, and Tape   Review of Systems Review of Systems  Constitutional: Negative for fever.  Gastrointestinal: Positive for nausea.  Genitourinary: Positive for dysuria and urgency.  Musculoskeletal: Positive for back pain.   Physical Exam Triage Vital Signs ED Triage Vitals  Enc Vitals Group     BP 06/03/20 1645 (!) 138/94     Pulse Rate 06/03/20 1645 69     Resp 06/03/20 1645 16     Temp 06/03/20 1645 97.8 F (36.6 C)     Temp Source 06/03/20 1645 Oral     SpO2 06/03/20 1645 100 %     Weight 06/03/20 1638 205 lb (93 kg)     Height 06/03/20 1638 4\' 11"  (1.499 m)     Head Circumference --      Peak Flow --      Pain Score 06/03/20 1637 9     Pain Loc --      Pain Edu? --      Excl. in [place]? --    Updated Vital Signs BP (!) 138/94 (BP Location: Right Arm)   Pulse 69   Temp 97.8 F (36.6 C) (Oral)   Resp 16   Ht 4\' 11"  (1.499 m)   Wt 93 kg   LMP 10/25/2016   SpO2 100%   BMI 41.40 kg/m   Visual Acuity Right Eye Distance:   Left Eye Distance:   Bilateral Distance:    Right Eye Near:   Left Eye Near:    Bilateral Near:     Physical Exam Constitutional:      Appearance: She is not toxic-appearing.     Comments: Appears in pain.  HENT:     Head: Normocephalic and atraumatic.  Eyes:     General:        Right eye: No discharge.        Left eye: No discharge.      Conjunctiva/sclera: Conjunctivae normal.  Cardiovascular:     Rate and Rhythm: Normal rate and regular rhythm.     Heart sounds: No murmur heard.   Pulmonary:     Effort:  Pulmonary effort is normal.     Breath sounds: Normal breath sounds. No wheezing or rales.  Abdominal:     Comments: Soft, nondistended.  Left sided tenderness on exam.  Left CVA tenderness.  Neurological:     Mental Status: She is alert.  Psychiatric:        Mood and Affect: Mood normal.        Behavior: Behavior normal.    UC Treatments / Results  Labs (all labs ordered are listed, but only abnormal results are displayed) Labs Reviewed  URINALYSIS, COMPLETE (UACMP) WITH MICROSCOPIC - Abnormal; Notable for the following components:      Result Value   APPearance CLOUDY (*)    Hgb urine dipstick LARGE (*)    Protein, ur >300 (*)    Nitrite POSITIVE (*)    Leukocytes,Ua MODERATE (*)    Bacteria, UA MANY (*)    All other components within normal limits  URINE CULTURE    EKG   Radiology No results found.  Procedures Procedures (including critical care time)  Medications Ordered in UC Medications - No data to display  Initial Impression / Assessment and Plan / UC Course  I have reviewed the triage vital signs and the nursing notes.  Pertinent labs & imaging results that were available during my care of the patient were reviewed by me and considered in my medical decision making (see chart for details).    44 year old female presents with UTI.  Possible developing pyelonephritis given CVA tenderness and nausea.  She is currently afebrile.  Sending culture.  Placing on Omnicef.  Final Clinical Impressions(s) / UC Diagnoses   Final diagnoses:  Urinary tract infection with hematuria, site unspecified     Discharge Instructions     Antibiotic as prescribed.  If you worsen, go to the ER.  Take care  Dr. Lacinda Axon    ED Prescriptions    Medication Sig Dispense Auth. Provider   cefdinir  (OMNICEF) 300 MG capsule Take 1 capsule (300 mg total) by mouth 2 (two) times daily. 14 capsule Thersa Salt G, DO     PDMP not reviewed this encounter.   Coral Spikes, [place] 06/03/20 [phone]

## 2020-06-03 NOTE — Discharge Instructions (Signed)
Antibiotic as prescribed.  If you worsen, go to the ER.  Take care  Dr. Lacinda Axon

## 2020-06-05 LAB — URINE CULTURE: Culture: >=100000 — AB

## 2020-09-20 ENCOUNTER — Other Ambulatory Visit: Payer: Self-pay

## 2020-09-20 ENCOUNTER — Ambulatory Visit
Admission: EM | Admit: 2020-09-20 | Discharge: 2020-09-20 | Disposition: A | Discharge status: Home / Self Care | Payer: BC Managed Care – PPO | Attending: Emergency Medicine | Admitting: Emergency Medicine

## 2020-09-20 ENCOUNTER — Encounter: Payer: Self-pay | Admitting: Emergency Medicine

## 2020-09-20 DIAGNOSIS — T8130XA Disruption of wound, unspecified, initial encounter: Secondary | ICD-10-CM

## 2020-09-20 MED ORDER — MUPIROCIN CALCIUM 2 % NA OINT: TOPICAL_OINTMENT | NASAL | 0 refills | Status: AC

## 2020-09-20 NOTE — ED Notes (Signed)
Applied dressing to right thigh with bacitracin, non adherent and medi-pore tape. Patient tolerated well.

## 2020-09-20 NOTE — ED Triage Notes (Signed)
Patient states that she had surgery on 09/09/20.  Patient states that has between 30-40 incisions on her abdomen, legs, and arms.  Patient states that the incision on her right thigh has opened up 2 days ago.  Patient reports redness, pain and drainage around the site.  Patient denies fevers.  Patient reports chills.

## 2020-09-20 NOTE — Discharge Instructions (Addendum)
Keep the wound clean and dry.  Apply mupirocin and a nonadherent dressing pad to the wound 3 times a day for the next 7 days.  Keep your follow-up appointment for November 30 as scheduled.  If you develop any increased drainage, redness, swelling, pain, red streaks ascending your thigh, swollen lymph nodes in your groin, or fever please go to the ER at [facility] for evaluation.

## 2020-09-20 NOTE — ED Provider Notes (Signed)
[facility]    CSN: [id number] Arrival date & time: 09/20/20  1157      History   Chief Complaint Chief Complaint  Patient presents with   Wound Check   Abdominal Pain    HPI Tiffany Andrade is a 44 y.o. female.   HPI   44 year old female here for evaluation of incision on her right thigh.  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  Patient reports that 2 days ago one of the incisions on her right hip/upper thigh opened and has been draining a serous drainage.  Patient reports that there has been some blood in there as well.  She states it is hot and red but she denies fever.  Patient spoke to her neurosurgeon, Dr. Lynnell Jude, directed her to come to the urgent care for evaluation.  She has a follow-up appointment on September 30, 2020.  Past Medical History:  Diagnosis Date   Anxiety    Depression    DVT (deep venous thrombosis) (HCC)    Hypertension    UTI (lower urinary tract infection)     There are no problems to display for this patient.   Past Surgical History:  Procedure Laterality Date   CESAREAN SECTION     x2   KIDNEY SURGERY     X2   TONSILLECTOMY     TUBAL LIGATION      OB History   No obstetric history on file.      Home Medications    Prior to Admission medications   Medication Sig Start Date End Date Taking? Authorizing Provider  albuterol (VENTOLIN HFA) 108 (90 Base) MCG/ACT inhaler Inhale into the lungs.   Yes [provider]  amLODipine (NORVASC) 5 MG tablet Take 5 mg by mouth daily.   Yes [provider]  B Complex-C-Calcium (GNP B-COMPLEX PLUS VITAMIN C) TABS Take by mouth.   Yes [provider]  busPIRone (BUSPAR) 10 MG tablet Take 10 mg by mouth 2 (two) times daily. 11/09/19  Yes [provider]  chlorthalidone (HYGROTON) 25 MG tablet Take 12.5 mg by mouth daily.   Yes [provider]    cyclobenzaprine (FLEXERIL) 10 MG tablet Take 10 mg by mouth at bedtime as needed. 12/23/19  Yes [provider]  fluticasone (FLONASE) 50 MCG/ACT nasal spray Place 1 spray into both nostrils daily. 05/20/20  Yes [provider]  gabapentin (NEURONTIN) 300 MG capsule Take 3 capsules by mouth 3 (three) times daily. 12/15/18 09/20/20 Yes [provider]  hydrOXYzine (ATARAX/VISTARIL) 25 MG tablet Take 25 mg by mouth every 8 (eight) hours as needed. 10/12/19  Yes [provider]  lisinopril-hydrochlorothiazide (PRINZIDE,ZESTORETIC) 20-25 MG tablet Take 1 tablet by mouth daily.   Yes [provider]  losartan (COZAAR) 25 MG tablet Take 25 mg by mouth daily. 09/21/19  Yes [provider]  MAGNESIUM PO Take by mouth.   Yes [provider]  meloxicam (MOBIC) 15 MG tablet Take 15 mg by mouth 2 (two) times daily. 09/14/19  Yes [provider]  potassium chloride (K-DUR,KLOR-CON) 10 MEQ tablet Take 10 mEq by mouth 2 (two) times daily.   Yes [provider]  rizatriptan (MAXALT-MLT) 10 MG disintegrating tablet Give one tablet may repeat in 2 hours if needed.  No more than 4 in 1 week 04/14/18  Yes [provider]  sertraline (ZOLOFT) 25 MG tablet TAKE 1 TABLET BY  MOUTH ONCE DAILY FOR 1 WEEK. IF TOLERATING WELL WITH NO SIDE EFFECTS MAY INCREASE TO 2 TABLETS BY MOUTH DAILY. 08/11/18  Yes [provider]  traZODone (DESYREL) 50 MG tablet prn 10/17/19  Yes [provider]  mupirocin nasal ointment (BACTROBAN) 2 % Apply to wound 3 times a day. 09/20/20   Margarette Canada, NP  nystatin ointment (MYCOSTATIN) Apply topically.    [provider]  Olopatadine HCl 0.2 % SOLN Apply 1 drop to eye daily. 05/12/20   [provider]  Spacer/Aero-Holding Chambers (AEROCHAMBER PLUS) inhaler Use as instructed 11/14/19   Melynda Ripple, MD  amitriptyline (ELAVIL) 25 MG tablet Take 25 mg by mouth at bedtime as  needed for sleep.  11/14/19  [provider]  POTASSIUM PO Take by mouth.  09/20/20  [provider]  topiramate (TOPAMAX) 25 MG tablet Please start at 25mg  nightly for a week. You may increase to 25mg  twice a day a week later if you are tolerating this well. You can increase to three times per day a week later if you are still tolerating this well. 05/01/20 09/20/20  [provider]    Family History Family History  Problem Relation Age of Onset   Hypertension Mother    Hypertension Sister    Hypertension Brother     Social History Social History   Tobacco Use   Smoking status: Never Smoker   Smokeless tobacco: Never Used  Scientific laboratory technician Use: Never used  Substance Use Topics   Alcohol use: No    Alcohol/week: 0.0 standard drinks   Drug use: No     Allergies   Codeine, Ivp dye [iodinated diagnostic agents], Sulfa antibiotics, and Tape   Review of Systems Review of Systems  Constitutional: Negative for activity change, appetite change and fever.  Respiratory: Negative for cough and shortness of breath.   Musculoskeletal: Negative for arthralgias and myalgias.  Skin: Positive for wound.  Neurological: Negative for weakness and numbness.  Hematological: Negative.   Psychiatric/Behavioral: Negative.      Physical Exam Triage Vital Signs ED Triage Vitals  Enc Vitals Group     BP 09/20/20 1218 (!) 149/100     Pulse Rate 09/20/20 1218 77     Resp 09/20/20 1218 14     Temp 09/20/20 1218 97.7 F (36.5 C)     Temp Source 09/20/20 1218 Oral     SpO2 09/20/20 1218 98 %     Weight 09/20/20 1214 200 lb (90.7 kg)     Height 09/20/20 1214 4\' 11"  (1.499 m)     Head Circumference --      Peak Flow --      Pain Score 09/20/20 1214 6     Pain Loc --      Pain Edu? --      Excl. in [place]? --    No data found.  Updated Vital Signs BP (!) 149/100 (BP Location: Left Arm) Comment: Patient states that she has not taken her BP medicine today.    Pulse 77    Temp 97.7 F (36.5 C) (Oral)    Resp 14    Ht 4\' 11"  (1.499 m)    Wt 200 lb (90.7 kg)    LMP 10/25/2016    SpO2 98%    BMI 40.40 kg/m   Visual Acuity Right Eye Distance:   Left Eye Distance:   Bilateral Distance:    Right Eye Near:   Left Eye Near:  Bilateral Near:     Physical Exam Vitals and nursing note reviewed.  Constitutional:      General: She is not in acute distress.    Appearance: She is well-developed. She is not toxic-appearing.  HENT:     Head: Normocephalic and atraumatic.  Eyes:     General: No scleral icterus.    Extraocular Movements: Extraocular movements intact.     Pupils: Pupils are equal, round, and reactive to light.  Cardiovascular:     Rate and Rhythm: Normal rate and regular rhythm.     Heart sounds: Normal heart sounds. No murmur heard.  No gallop.   Pulmonary:     Effort: Pulmonary effort is normal.     Breath sounds: Normal breath sounds. No wheezing or rales.  Skin:    General: Skin is warm and dry.     Capillary Refill: Capillary refill takes less than 2 seconds.     Comments: Multiple healing and well approximated incisions across patient's abdomen, both hands, both arms, and both eyes.  Patient has one area that is open, approximately the size of a nickel, with a red wound bed and serous drainage.  Sample collected and sent for culture.  The area has minimal redness around the edges but no surrounding erythema, induration, tenderness, or heat.  Neurological:     General: No focal deficit present.     Mental Status: She is alert and oriented to person, place, and time.  Psychiatric:        Mood and Affect: Mood normal.        Behavior: Behavior normal.      UC Treatments / Results  Labs (all labs ordered are listed, but only abnormal results are displayed) Labs Reviewed  AEROBIC CULTURE (SUPERFICIAL SPECIMEN)    EKG   Radiology No results found.  Procedures Procedures (including critical care  time)  Medications Ordered in UC Medications - No data to display  Initial Impression / Assessment and Plan / UC Course  I have reviewed the triage vital signs and the nursing notes.  Pertinent labs & imaging results that were available during my care of the patient were reviewed by me and considered in my medical decision making (see chart for details).   Patient is here for evaluation of a dehisced surgical wound on her right hip/thigh.  Patient had surgery on 9 November and has multiple incisions on her body.  This incision spot size of a nickel, has some serous drainage from the wound, no induration heat or signs of systemic infection.  Will prescribe you Pearson with dressing changes 3 times daily times neck 7 days.  Patient has a follow-up appointment in 10 days with her neurosurgeon.  Patient advised to go to the ER if she develops increased redness, pain, fever, red streaks ascending her thigh, or swollen lymph nodes in her groin.   Final Clinical Impressions(s) / UC Diagnoses   Final diagnoses:  Wound dehiscence     Discharge Instructions     Keep the wound clean and dry.  Apply mupirocin and a nonadherent dressing pad to the wound 3 times a day for the next 7 days.  Keep your follow-up appointment for November 30 as scheduled.  If you develop any increased drainage, redness, swelling, pain, red streaks ascending your thigh, swollen lymph nodes in your groin, or fever please go to the ER at [facility] for evaluation.    ED Prescriptions    Medication Sig Dispense Auth. Provider  mupirocin nasal ointment (BACTROBAN) 2 % Apply to wound 3 times a day. 22 g Margarette Canada, NP     PDMP not reviewed this encounter.   Margarette Canada, NP 09/20/20 1307

## 2020-09-22 DIAGNOSIS — H21562 Pupillary abnormality, left eye: Secondary | ICD-10-CM | POA: Insufficient documentation

## 2020-09-22 DIAGNOSIS — H53462 Homonymous bilateral field defects, left side: Secondary | ICD-10-CM | POA: Insufficient documentation

## 2020-09-23 LAB — AEROBIC CULTURE W GRAM STAIN (SUPERFICIAL SPECIMEN)
Culture: NORMAL
Special Requests: NORMAL

## 2020-12-24 DIAGNOSIS — F32A Depression, unspecified: Secondary | ICD-10-CM | POA: Insufficient documentation

## 2021-02-02 ENCOUNTER — Ambulatory Visit
Admission: EM | Admit: 2021-02-02 | Discharge: 2021-02-02 | Disposition: A | Discharge status: Home / Self Care | Payer: BC Managed Care – PPO | Attending: Family Medicine | Admitting: Family Medicine

## 2021-02-02 ENCOUNTER — Other Ambulatory Visit: Payer: Self-pay

## 2021-02-02 ENCOUNTER — Encounter: Payer: Self-pay | Admitting: Emergency Medicine

## 2021-02-02 DIAGNOSIS — N10 Acute tubulo-interstitial nephritis: Secondary | ICD-10-CM | POA: Diagnosis present

## 2021-02-02 LAB — URINALYSIS, COMPLETE (UACMP) WITH MICROSCOPIC: WBC, UA: >50 WBC/hpf (ref 0–5)

## 2021-02-02 MED ORDER — CIPROFLOXACIN HCL 500 MG PO TABS
500.0000 mg | ORAL_TABLET | Freq: Two times a day (BID) | ORAL | 0 refills | Status: DC
Start: 2021-02-02 — End: 2021-04-13

## 2021-02-02 NOTE — ED Provider Notes (Signed)
[facility]    CSN: [id number] Arrival date & time: 02/02/21  0950  History   Chief Complaint Chief Complaint  Patient presents with  . Dysuria       . Urinary Urgency   HPI   45 year old female presents with urinary symptoms.  Patient reports that she has had symptoms since Saturday.  She reports dysuria, frequency, and urgency.  She states that she has had fever, T-max 100.5.  She reports back pain as well.  Also notes lower abdominal pain.  She states that she has a history of UTI.  Her pain is currently 8/10 in severity.  No relieving factors.  She has taken Azo without resolution.  No other complaints.  Past Medical History:  Diagnosis Date  . Anxiety   . Depression   . DVT (deep venous thrombosis) ([place])   . Hypertension   . UTI (lower urinary tract infection)    Past Surgical History:  Procedure Laterality Date  . CESAREAN SECTION     x2  . KIDNEY SURGERY     X2  . TONSILLECTOMY    . TUBAL LIGATION      OB History   No obstetric history on file.      Home Medications    Prior to Admission medications   Medication Sig Start Date End Date Taking? Authorizing Provider  albuterol (VENTOLIN HFA) 108 (90 Base) MCG/ACT inhaler Inhale into the lungs.   Yes [provider]  amLODipine (NORVASC) 5 MG tablet Take 5 mg by mouth daily.   Yes [provider]  B Complex-C-Calcium (GNP B-COMPLEX PLUS VITAMIN C) TABS Take by mouth.   Yes [provider]  busPIRone (BUSPAR) 10 MG tablet Take 10 mg by mouth 2 (two) times daily. 11/09/19  Yes [provider]  candesartan (ATACAND) 32 MG tablet Take by mouth. 12/26/20  Yes [provider]  chlorthalidone (HYGROTON) 25 MG tablet Take 12.5 mg by mouth daily.   Yes [provider]  ciprofloxacin (CIPRO) 500 MG tablet Take 1 tablet (500 mg total) by mouth 2 (two) times daily. 02/02/21  Yes Salimah Martinovich G, DO  cyclobenzaprine (FLEXERIL) 10 MG tablet Take 10 mg by mouth at  bedtime as needed. 12/23/19  Yes [provider]  fluticasone (FLONASE) 50 MCG/ACT nasal spray Place 1 spray into both nostrils daily. 05/20/20  Yes [provider]  Galcanezumab-gnlm 120 MG/ML SOAJ Inject into the skin. 12/24/20  Yes [provider]  hydrOXYzine (ATARAX/VISTARIL) 25 MG tablet Take 25 mg by mouth every 8 (eight) hours as needed. 10/12/19  Yes [provider]  MAGNESIUM PO Take by mouth.   Yes [provider]  meloxicam (MOBIC) 15 MG tablet Take 15 mg by mouth 2 (two) times daily. 09/14/19  Yes [provider]  potassium chloride (K-DUR,KLOR-CON) 10 MEQ tablet Take 10 mEq by mouth 2 (two) times daily.   Yes [provider]  rizatriptan (MAXALT-MLT) 10 MG disintegrating tablet Give one tablet may repeat in 2 hours if needed.  No more than 4 in 1 week 04/14/18  Yes [provider]  sertraline (ZOLOFT) 25 MG tablet TAKE 1 TABLET BY MOUTH ONCE DAILY FOR 1 WEEK. IF TOLERATING WELL WITH NO SIDE EFFECTS MAY INCREASE TO 2 TABLETS BY MOUTH DAILY. 08/11/18  Yes [provider]  traZODone (DESYREL) 50 MG tablet prn 10/17/19  Yes [provider]  gabapentin (NEURONTIN) 300 MG capsule Take 3 capsules by mouth 3 (three) times daily. 12/15/18 09/20/20  [provider]  metaxalone (SKELAXIN) 800 MG tablet TAKE 1 TABLET BY MOUTH 2 TIMES DAILY AS NEEDED FOR PAIN. 12/10/20   [provider]  mupirocin nasal ointment (BACTROBAN) 2 % Apply to wound 3 times a day. 09/20/20   Margarette Canada, NP  nystatin ointment (MYCOSTATIN) Apply topically.    [provider]  Olopatadine HCl 0.2 % SOLN Apply 1 drop to eye daily. 05/12/20   [provider]  Spacer/Aero-Holding Chambers (AEROCHAMBER PLUS) inhaler Use as instructed 11/14/19   Melynda Ripple, MD  amitriptyline (ELAVIL) 25 MG tablet Take 25 mg by mouth at bedtime as needed for sleep.  11/14/19  [provider]   lisinopril-hydrochlorothiazide (PRINZIDE,ZESTORETIC) 20-25 MG tablet Take 1 tablet by mouth daily.  02/02/21  [provider]  POTASSIUM PO Take by mouth.  09/20/20  [provider]  topiramate (TOPAMAX) 25 MG tablet Please start at 25mg  nightly for a week. You may increase to 25mg  twice a day a week later if you are tolerating this well. You can increase to three times per day a week later if you are still tolerating this well. 05/01/20 09/20/20  [provider]    Family History Family History  Problem Relation Age of Onset  . Hypertension Mother   . Hypertension Sister   . Hypertension Brother     Social History Social History   Tobacco Use  . Smoking status: Never Smoker  . Smokeless tobacco: Never Used  Vaping Use  . Vaping Use: Never used  Substance Use Topics  . Alcohol use: No    Alcohol/week: 0.0 standard drinks  . Drug use: No     Allergies   Codeine, Ivp dye [iodinated diagnostic agents], Sulfa antibiotics, and Tape   Review of Systems Review of Systems  Constitutional: Positive for fever.  Gastrointestinal: Positive for abdominal pain.  Genitourinary: Positive for dysuria, frequency and urgency.  Musculoskeletal: Positive for back pain.   Physical Exam Triage Vital Signs ED Triage Vitals  Enc Vitals Group     BP 02/02/21 1052 131/87     Pulse Rate 02/02/21 1052 75     Resp 02/02/21 1052 18     Temp 02/02/21 1052 98.4 F (36.9 C)     Temp Source 02/02/21 1052 Oral     SpO2 02/02/21 1052 97 %     Weight 02/02/21 1048 200 lb (90.7 kg)     Height 02/02/21 1048 4\' 11"  (1.499 m)     Head Circumference --      Peak Flow --      Pain Score 02/02/21 1048 8     Pain Loc --      Pain Edu? --      Excl. in [place]? --    No data found.  Updated Vital Signs BP 131/87 (BP Location: Right Arm)   Pulse 75   Temp 98.4 F (36.9 C) (Oral)   Resp 18   Ht 4\' 11"  (1.499 m)   Wt 90.7 kg   LMP 10/25/2016   SpO2 97%   BMI 40.40 kg/m    Visual Acuity Right Eye Distance:   Left Eye Distance:   Bilateral Distance:    Right Eye Near:   Left Eye Near:    Bilateral Near:     Physical Exam Vitals and nursing note reviewed.  Constitutional:      General: She is not in acute distress.    Appearance: Normal appearance. She is obese. She is not ill-appearing.  HENT:     Head: Normocephalic and atraumatic.  Cardiovascular:     Rate and Rhythm: Normal rate and regular rhythm.  Pulmonary:     Effort: Pulmonary effort is normal.     Breath sounds: Normal breath sounds. No wheezing or rales.  Abdominal:     General: There is no distension.     Palpations: Abdomen is soft.     Tenderness: There is left CVA tenderness.     Comments: Tenderness to palpation in the suprapubic region.  Neurological:     Mental Status: She is alert.  Psychiatric:        Mood and Affect: Mood normal.        Behavior: Behavior normal.    UC Treatments / Results  Labs (all labs ordered are listed, but only abnormal results are displayed) Labs Reviewed  URINALYSIS, COMPLETE (UACMP) WITH MICROSCOPIC - Abnormal; Notable for the following components:      Result Value   Color, Urine ORANGE (*)    APPearance CLOUDY (*)    Glucose, UA   (*)    Value: TEST NOT REPORTED DUE TO COLOR INTERFERENCE OF URINE PIGMENT   Hgb urine dipstick   (*)    Value: TEST NOT REPORTED DUE TO COLOR INTERFERENCE OF URINE PIGMENT   Bilirubin Urine   (*)    Value: TEST NOT REPORTED DUE TO COLOR INTERFERENCE OF URINE PIGMENT   Ketones, ur   (*)    Value: TEST NOT REPORTED DUE TO COLOR INTERFERENCE OF URINE PIGMENT   Protein, ur   (*)    Value: TEST NOT REPORTED DUE TO COLOR INTERFERENCE OF URINE PIGMENT   Nitrite   (*)    Value: TEST NOT REPORTED DUE TO COLOR INTERFERENCE OF URINE PIGMENT   Leukocytes,Ua   (*)    Value: TEST NOT REPORTED DUE TO COLOR INTERFERENCE OF URINE PIGMENT   Bacteria, UA MANY (*)    All other components within normal limits  URINE  CULTURE    EKG   Radiology No results found.  Procedures Procedures (including critical care time)  Medications Ordered in UC Medications - No data to display  Initial Impression / Assessment and Plan / UC Course  I have reviewed the triage vital signs and the nursing notes.  Pertinent labs & imaging results that were available during my care of the patient were reviewed by me and considered in my medical decision making (see chart for details).    45 year old female presents with pyelonephritis.  Acute illness with systemic symptoms as she has had fever.  CVA tenderness on exam.  Placing on Cipro.  Awaiting culture.  Final Clinical Impressions(s) / UC Diagnoses   Final diagnoses:  Acute pyelonephritis   Discharge Instructions   None    ED Prescriptions    Medication Sig Dispense Auth. Provider   ciprofloxacin (CIPRO) 500 MG tablet Take 1 tablet (500 mg total) by mouth 2 (two) times daily. 14 tablet Coral Spikes, DO     PDMP not reviewed this encounter.   Coral Spikes, [place] 02/02/21 1328

## 2021-02-02 NOTE — ED Triage Notes (Signed)
Patient c/o urinary urgency and dysuria that started Saturday.

## 2021-02-05 LAB — URINE CULTURE
Culture: >=100000 — AB
Special Requests: NORMAL

## 2021-04-13 ENCOUNTER — Ambulatory Visit
Admission: EM | Admit: 2021-04-13 | Discharge: 2021-04-13 | Disposition: A | Discharge status: Home / Self Care | Payer: BC Managed Care – PPO | Attending: Family Medicine | Admitting: Family Medicine

## 2021-04-13 ENCOUNTER — Other Ambulatory Visit: Payer: Self-pay

## 2021-04-13 DIAGNOSIS — N3000 Acute cystitis without hematuria: Secondary | ICD-10-CM | POA: Diagnosis present

## 2021-04-13 LAB — POCT URINALYSIS DIP (DEVICE)
Bilirubin Urine: NEGATIVE
Glucose, UA: 100 mg/dL — AB
Ketones, ur: NEGATIVE mg/dL
Nitrite: POSITIVE — AB
Protein, ur: 30 mg/dL — AB
Specific Gravity, Urine: >=1.03 (ref 1.005–1.030)
Urobilinogen, UA: 1 mg/dL (ref 0.0–1.0)
pH: 5 (ref 5.0–8.0)

## 2021-04-13 MED ORDER — CEPHALEXIN 500 MG PO CAPS: 500.0000 mg | ORAL_CAPSULE | Freq: Two times a day (BID) | ORAL | 0 refills | Status: DC

## 2021-04-13 NOTE — ED Provider Notes (Signed)
[facility]    CSN: [id number] Arrival date & time: 04/13/21  1715      History   Chief Complaint Chief Complaint  Patient presents with   Dysuria   HPI  45 year old female presents with concern for UTI.   Symptoms started yesterday. Reports urinary frequency, dysuria, and urgency. Associated lower abdominal pain. No fever. She reports nausea but denies vomiting.  Has a history of recurrent UTI.  Pain is currently 8/10 in severity.  She was unable to see her urologist given timing and location.  No other associated symptoms.  No other complaints.  Past Medical History:  Diagnosis Date   Anxiety    Depression    DVT (deep venous thrombosis) (HCC)    Hypertension    UTI (lower urinary tract infection)    Past Surgical History:  Procedure Laterality Date   CESAREAN SECTION     x2   KIDNEY SURGERY     X2   TONSILLECTOMY     TUBAL LIGATION      OB History   No obstetric history on file.      Home Medications    Prior to Admission medications   Medication Sig Start Date End Date Taking? Authorizing Provider  albuterol (VENTOLIN HFA) 108 (90 Base) MCG/ACT inhaler Inhale into the lungs.   Yes [provider]  amLODipine (NORVASC) 5 MG tablet Take 5 mg by mouth daily.   Yes [provider]  B Complex-C-Calcium (GNP B-COMPLEX PLUS VITAMIN C) TABS Take by mouth.   Yes [provider]  beclomethasone (QVAR REDIHALER) 40 MCG/ACT inhaler Inhale into the lungs. 01/18/21  Yes [provider]  busPIRone (BUSPAR) 10 MG tablet Take 10 mg by mouth 2 (two) times daily. 11/09/19  Yes [provider]  candesartan (ATACAND) 32 MG tablet Take by mouth. 12/26/20  Yes [provider]  cephALEXin (KEFLEX) 500 MG capsule Take 1 capsule (500 mg total) by mouth 2 (two) times daily. 04/13/21  Yes Monique Hefty G, DO  cetirizine (ZYRTEC) 10 MG tablet Take 10 mg by mouth daily. 03/19/21  Yes [provider]  chlorthalidone  (HYGROTON) 25 MG tablet Take 12.5 mg by mouth daily.   Yes [provider]  cyclobenzaprine (FLEXERIL) 10 MG tablet Take 10 mg by mouth at bedtime as needed. 12/23/19  Yes [provider]  fluticasone (FLONASE) 50 MCG/ACT nasal spray Place 1 spray into both nostrils daily. 05/20/20  Yes [provider]  gabapentin (NEURONTIN) 300 MG capsule Take 3 capsules by mouth 3 (three) times daily. 12/15/18 04/13/21 Yes [provider]  Galcanezumab-gnlm 120 MG/ML SOAJ Inject into the skin. 12/24/20  Yes [provider]  hydrOXYzine (VISTARIL) 25 MG capsule Take 25-50 mg by mouth 2 (two) times daily as needed. 01/23/21  Yes [provider]  lidocaine (LIDODERM) 5 % SMARTSIG:Patch(s) Topical 03/06/21  Yes [provider]  MAGNESIUM PO Take by mouth.   Yes [provider]  meloxicam (MOBIC) 15 MG tablet Take 15 mg by mouth 2 (two) times daily. 09/14/19  Yes [provider]  metaxalone (SKELAXIN) 800 MG tablet TAKE 1 TABLET BY MOUTH 2 TIMES DAILY AS NEEDED FOR PAIN. 12/10/20  Yes [provider]  nystatin ointment (MYCOSTATIN) Apply topically.   Yes [provider]  Olopatadine HCl 0.2 % SOLN Apply 1 drop to eye daily. 05/12/20  Yes [provider]  potassium chloride (K-DUR,KLOR-CON) 10 MEQ tablet Take 10 mEq by mouth 2 (two) times  daily.   Yes [provider]  rizatriptan (MAXALT-MLT) 10 MG disintegrating tablet Give one tablet may repeat in 2 hours if needed.  No more than 4 in 1 week 04/14/18  Yes [provider]  sertraline (ZOLOFT) 100 MG tablet Take 1 tablet by mouth daily. 03/20/21  Yes [provider]  traZODone (DESYREL) 50 MG tablet prn 10/17/19  Yes [provider]  mupirocin nasal ointment (BACTROBAN) 2 % Apply to wound 3 times a day. 09/20/20   Margarette Canada, NP  Spacer/Aero-Holding Chambers (AEROCHAMBER PLUS) inhaler Use as instructed 11/14/19   Melynda Ripple, MD   amitriptyline (ELAVIL) 25 MG tablet Take 25 mg by mouth at bedtime as needed for sleep.  11/14/19  [provider]  lisinopril-hydrochlorothiazide (PRINZIDE,ZESTORETIC) 20-25 MG tablet Take 1 tablet by mouth daily.  02/02/21  [provider]  POTASSIUM PO Take by mouth.  09/20/20  [provider]  topiramate (TOPAMAX) 25 MG tablet Please start at 25mg  nightly for a week. You may increase to 25mg  twice a day a week later if you are tolerating this well. You can increase to three times per day a week later if you are still tolerating this well. 05/01/20 09/20/20  [provider]    Family History Family History  Problem Relation Age of Onset   Hypertension Mother    Hypertension Sister    Hypertension Brother     Social History Social History   Tobacco Use   Smoking status: Never   Smokeless tobacco: Never  Vaping Use   Vaping Use: Never used  Substance Use Topics   Alcohol use: No    Alcohol/week: 0.0 standard drinks   Drug use: No     Allergies   Codeine, Ivp dye [iodinated diagnostic agents], Sulfa antibiotics, and Tape   Review of Systems Review of Systems  Constitutional:  Negative for fever.  Gastrointestinal:  Positive for abdominal pain and nausea.  Genitourinary:  Negative for dysuria, frequency and urgency.   Physical Exam Triage Vital Signs ED Triage Vitals  Enc Vitals Group     BP 04/13/21 1804 135/89     Pulse Rate 04/13/21 1804 96     Resp 04/13/21 1804 18     Temp 04/13/21 1804 98 F (36.7 C)     Temp src --      SpO2 04/13/21 1804 97 %     Weight 04/13/21 1800 204 lb (92.5 kg)     Height 04/13/21 1800 4\' 11"  (1.499 m)     Head Circumference --      Peak Flow --      Pain Score 04/13/21 1800 8     Pain Loc --      Pain Edu? --      Excl. in [place]? --    Updated Vital Signs BP 135/89 (BP Location: Left Arm)   Pulse 96   Temp 98 F (36.7 C)   Resp 18   Ht 4\' 11"  (1.499 m)   Wt 92.5 kg   LMP 10/25/2016   SpO2  97%   BMI 41.20 kg/m   Visual Acuity Right Eye Distance:   Left Eye Distance:   Bilateral Distance:    Right Eye Near:   Left Eye Near:    Bilateral Near:     Physical Exam Vitals and nursing note reviewed.  Constitutional:      General: She is not in acute distress.    Appearance: Normal appearance. She is obese. She is  not ill-appearing.  HENT:     Head: Normocephalic and atraumatic.  Cardiovascular:     Rate and Rhythm: Normal rate and regular rhythm.  Pulmonary:     Effort: Pulmonary effort is normal.     Breath sounds: Normal breath sounds. No wheezing, rhonchi or rales.  Abdominal:     General: There is no distension.     Palpations: Abdomen is soft.     Tenderness: There is left CVA tenderness.     Comments: Mild tenderness in the suprapubic region.  Neurological:     Mental Status: She is alert.  Psychiatric:        Mood and Affect: Mood normal.        Behavior: Behavior normal.     UC Treatments / Results  Labs (all labs ordered are listed, but only abnormal results are displayed) Labs Reviewed  POCT URINALYSIS DIP (DEVICE) - Abnormal; Notable for the following components:      Result Value   Glucose, UA 100 (*)    Hgb urine dipstick TRACE (*)    Protein, ur 30 (*)    Nitrite POSITIVE (*)    Leukocytes,Ua TRACE (*)    All other components within normal limits  URINE CULTURE  URINALYSIS, COMPLETE (UACMP) WITH MICROSCOPIC    EKG   Radiology No results found.  Procedures Procedures (including critical care time)  Medications Ordered in UC Medications - No data to display  Initial Impression / Assessment and Plan / UC Course  I have reviewed the triage vital signs and the nursing notes.  Pertinent labs & imaging results that were available during my care of the patient were reviewed by me and considered in my medical decision making (see chart for details).    45 year old female presents with UTI.  Sending culture.  Placing on  Keflex.  Final Clinical Impressions(s) / UC Diagnoses   Final diagnoses:  Acute cystitis without hematuria   Discharge Instructions   None    ED Prescriptions     Medication Sig Dispense Auth. Provider   cephALEXin (KEFLEX) 500 MG capsule Take 1 capsule (500 mg total) by mouth 2 (two) times daily. 14 capsule Thersa Salt G, DO      PDMP not reviewed this encounter.   Coral Spikes, [place] 04/13/21 1830

## 2021-04-13 NOTE — ED Triage Notes (Signed)
Pt c/o urinary frequency, dysuria, urgency since yesterday. Pt also reports some nausea, denies f/v/d or other symptoms.

## 2021-04-16 LAB — URINE CULTURE
Culture: >=100000 — AB
Special Requests: NORMAL

## 2021-05-09 ENCOUNTER — Other Ambulatory Visit: Payer: Self-pay

## 2021-05-09 ENCOUNTER — Encounter: Payer: Self-pay | Admitting: Emergency Medicine

## 2021-05-09 ENCOUNTER — Ambulatory Visit
Admission: EM | Admit: 2021-05-09 | Discharge: 2021-05-09 | Disposition: A | Discharge status: Home / Self Care | Payer: BC Managed Care – PPO | Attending: Physician Assistant | Admitting: Physician Assistant

## 2021-05-09 DIAGNOSIS — R110 Nausea: Secondary | ICD-10-CM | POA: Insufficient documentation

## 2021-05-09 DIAGNOSIS — R300 Dysuria: Secondary | ICD-10-CM

## 2021-05-09 DIAGNOSIS — R109 Unspecified abdominal pain: Secondary | ICD-10-CM | POA: Diagnosis present

## 2021-05-09 DIAGNOSIS — N10 Acute tubulo-interstitial nephritis: Secondary | ICD-10-CM | POA: Insufficient documentation

## 2021-05-09 LAB — POCT URINALYSIS DIP (DEVICE)
Bilirubin Urine: NEGATIVE
Glucose, UA: NEGATIVE mg/dL
Ketones, ur: NEGATIVE mg/dL
Nitrite: POSITIVE — AB
Protein, ur: >=300 mg/dL — AB
Specific Gravity, Urine: >=1.03 (ref 1.005–1.030)
Urobilinogen, UA: 0.2 mg/dL (ref 0.0–1.0)
pH: 7 (ref 5.0–8.0)

## 2021-05-09 MED ORDER — ONDANSETRON 4 MG PO TBDP: 4.0000 mg | ORAL_TABLET | Freq: Three times a day (TID) | ORAL | 0 refills | Status: DC | PRN

## 2021-05-09 MED ORDER — CIPROFLOXACIN HCL 500 MG PO TABS: 500.0000 mg | ORAL_TABLET | Freq: Two times a day (BID) | ORAL | 0 refills | Status: AC

## 2021-05-09 NOTE — Discharge Instructions (Addendum)
You have a another urinary infection.  Since you have flank pain, fatigue and nausea with that and your history of recurrent UTIs, this is suspicious for the infection moving toward her kidneys.  I have sent in Cipro.  Have also sent in Zofran.  Increase rest and fluids.  If you develop a fever, worsening flank or abdominal pain, feel more fatigued or weak or any she go to the ER.  As we discussed, sometimes people have to be admitted for these infections.  You should start to be feeling better over the next couple of days.  Please call your urologist for an appointment.

## 2021-05-09 NOTE — ED Provider Notes (Signed)
[facility]    CSN: [id number] Arrival date & time: 05/09/21  1244      History   Chief Complaint Chief Complaint  Patient presents with   Urinary Frequency   Dysuria    HPI Tiffany Andrade is a 45 y.o. female presenting for onset of dysuria, urinary frequency and urgency as well as left flank pain, nausea and fatigue as well as body aches today.  Patient admits to history of recurrent UTIs and says that she gets 6-7 UTIs a year.  Admits to history of bladder reflux and states that she has had a couple of kidney surgeries.  Patient says she does see a urologist but has not been there in a couple of months.  She says that over the past 3 months she has had 2 other UTIs.  She was treated with Cipro in April and Keflex last month.  Patient says she has been trying to hydrate well.  She denies any fevers.  No vomiting.  No other concerns.  HPI  Past Medical History:  Diagnosis Date   Anxiety    Depression    DVT (deep venous thrombosis) (HCC)    Hypertension    UTI (lower urinary tract infection)     There are no problems to display for this patient.   Past Surgical History:  Procedure Laterality Date   CESAREAN SECTION     x2   KIDNEY SURGERY     X2   TONSILLECTOMY     TUBAL LIGATION      OB History   No obstetric history on file.      Home Medications    Prior to Admission medications   Medication Sig Start Date End Date Taking? Authorizing Provider  amLODipine (NORVASC) 5 MG tablet Take 5 mg by mouth daily.   Yes [provider]  B Complex-C-Calcium (GNP B-COMPLEX PLUS VITAMIN C) TABS Take by mouth.   Yes [provider]  beclomethasone (QVAR REDIHALER) 40 MCG/ACT inhaler Inhale into the lungs. 01/18/21  Yes [provider]  busPIRone (BUSPAR) 10 MG tablet Take 10 mg by mouth 2 (two) times daily. 11/09/19  Yes [provider]  cetirizine (ZYRTEC) 10 MG tablet Take 10 mg by mouth daily. 03/19/21  Yes [provider]  chlorthalidone (HYGROTON) 25 MG tablet Take 12.5 mg by mouth daily.   Yes [provider]  ciprofloxacin (CIPRO) 500 MG tablet Take 1 tablet (500 mg total) by mouth every 12 (twelve) hours for 7 days. 05/09/21 05/16/21 Yes Danton Clap, PA-C  cyclobenzaprine (FLEXERIL) 10 MG tablet Take 10 mg by mouth at bedtime as needed. 12/23/19  Yes [provider]  fluticasone (FLONASE) 50 MCG/ACT nasal spray Place 1 spray into both nostrils daily. 05/20/20  Yes [provider]  Galcanezumab-gnlm 120 MG/ML SOAJ Inject into the skin. 12/24/20  Yes [provider]  hydrOXYzine (VISTARIL) 25 MG capsule Take 25-50 mg by mouth 2 (two) times daily as needed. 01/23/21  Yes [provider]  MAGNESIUM PO Take by mouth.   Yes [provider]  meloxicam (MOBIC) 15 MG tablet Take 15 mg by mouth 2 (two) times daily. 09/14/19  Yes [provider]  metaxalone (SKELAXIN) 800 MG tablet TAKE 1 TABLET BY MOUTH 2 TIMES DAILY AS NEEDED FOR PAIN. 12/10/20  Yes [provider]  ondansetron (ZOFRAN ODT) 4 MG disintegrating tablet Take 1 tablet (4 mg total) by mouth every 8 (eight) hours as needed for nausea or  vomiting. 05/09/21  Yes Laurene Footman B, PA-C  potassium chloride (K-DUR,KLOR-CON) 10 MEQ tablet Take 10 mEq by mouth 2 (two) times daily.   Yes [provider]  sertraline (ZOLOFT) 100 MG tablet Take 1 tablet by mouth daily. 03/20/21  Yes [provider]  traZODone (DESYREL) 50 MG tablet prn 10/17/19  Yes [provider]  albuterol (VENTOLIN HFA) 108 (90 Base) MCG/ACT inhaler Inhale into the lungs.    [provider]  candesartan (ATACAND) 32 MG tablet Take by mouth. 12/26/20   [provider]  gabapentin (NEURONTIN) 300 MG capsule Take 3 capsules by mouth 3 (three) times daily. 12/15/18 04/13/21  [provider]  lidocaine (LIDODERM) 5 % SMARTSIG:Patch(s) Topical 03/06/21   [provider]   mupirocin nasal ointment (BACTROBAN) 2 % Apply to wound 3 times a day. 09/20/20   Margarette Canada, NP  nystatin ointment (MYCOSTATIN) Apply topically.    [provider]  Olopatadine HCl 0.2 % SOLN Apply 1 drop to eye daily. 05/12/20   [provider]  rizatriptan (MAXALT-MLT) 10 MG disintegrating tablet Give one tablet may repeat in 2 hours if needed.  No more than 4 in 1 week 04/14/18   [provider]  Spacer/Aero-Holding Chambers (AEROCHAMBER PLUS) inhaler Use as instructed 11/14/19   Melynda Ripple, MD  amitriptyline (ELAVIL) 25 MG tablet Take 25 mg by mouth at bedtime as needed for sleep.  11/14/19  [provider]  lisinopril-hydrochlorothiazide (PRINZIDE,ZESTORETIC) 20-25 MG tablet Take 1 tablet by mouth daily.  02/02/21  [provider]  POTASSIUM PO Take by mouth.  09/20/20  [provider]  topiramate (TOPAMAX) 25 MG tablet Please start at 25mg  nightly for a week. You may increase to 25mg  twice a day a week later if you are tolerating this well. You can increase to three times per day a week later if you are still tolerating this well. 05/01/20 09/20/20  [provider]    Family History Family History  Problem Relation Age of Onset   Hypertension Mother    Hypertension Sister    Hypertension Brother     Social History Social History   Tobacco Use   Smoking status: Never   Smokeless tobacco: Never  Vaping Use   Vaping Use: Never used  Substance Use Topics   Alcohol use: No    Alcohol/week: 0.0 standard drinks   Drug use: No     Allergies   Codeine, Ivp dye [iodinated diagnostic agents], Sulfa antibiotics, and Tape   Review of Systems Review of Systems  Constitutional:  Positive for fatigue. Negative for chills and fever.  Gastrointestinal:  Positive for abdominal pain. Negative for diarrhea, nausea and vomiting.  Genitourinary:  Positive for dysuria, flank pain, frequency and urgency. Negative for decreased  urine volume, hematuria, pelvic pain, vaginal bleeding, vaginal discharge and vaginal pain.  Musculoskeletal:  Positive for back pain and myalgias.  Skin:  Negative for rash.    Physical Exam Triage Vital Signs ED Triage Vitals  Enc Vitals Group     BP 05/09/21 1259 135/90     Pulse Rate 05/09/21 1259 87     Resp 05/09/21 1259 14     Temp 05/09/21 1259 97.8 F (36.6 C)     Temp Source 05/09/21 1259 Oral     SpO2 05/09/21 1259 98 %     Weight 05/09/21 1255 200 lb (90.7 kg)     Height 05/09/21 1255 4\' 11"  (1.499 m)     Head  Circumference --      Peak Flow --      Pain Score 05/09/21 1255 9     Pain Loc --      Pain Edu? --      Excl. in [place]? --    No data found.  Updated Vital Signs BP 135/90 (BP Location: Left Arm)   Pulse 87   Temp 97.8 F (36.6 C) (Oral)   Resp 14   Ht 4\' 11"  (1.499 m)   Wt 200 lb (90.7 kg)   LMP 10/25/2016   SpO2 98%   BMI 40.40 kg/m      Physical Exam Vitals and nursing note reviewed.  Constitutional:      General: She is not in acute distress.    Appearance: Normal appearance. She is not ill-appearing or toxic-appearing.  HENT:     Head: Normocephalic and atraumatic.  Eyes:     General: No scleral icterus.       Right eye: No discharge.        Left eye: No discharge.     Conjunctiva/sclera: Conjunctivae normal.  Cardiovascular:     Rate and Rhythm: Normal rate and regular rhythm.     Heart sounds: Normal heart sounds.  Pulmonary:     Effort: Pulmonary effort is normal. No respiratory distress.     Breath sounds: Normal breath sounds.  Abdominal:     Palpations: Abdomen is soft.     Tenderness: There is abdominal tenderness (LLQ and suprapubic). There is left CVA tenderness. There is no right CVA tenderness.  Musculoskeletal:     Cervical back: Neck supple.  Skin:    General: Skin is dry.  Neurological:     General: No focal deficit present.     Mental Status: She is alert. Mental status is at baseline.     Motor: No weakness.      Gait: Gait normal.  Psychiatric:        Mood and Affect: Mood normal.        Behavior: Behavior normal.        Thought Content: Thought content normal.     UC Treatments / Results  Labs (all labs ordered are listed, but only abnormal results are displayed) Labs Reviewed  POCT URINALYSIS DIP (DEVICE) - Abnormal; Notable for the following components:      Result Value   Hgb urine dipstick MODERATE (*)    Protein, ur >=300 (*)    Nitrite POSITIVE (*)    Leukocytes,Ua TRACE (*)    All other components within normal limits  URINE CULTURE  POCT URINALYSIS DIPSTICK, ED / UC    EKG   Radiology No results found.  Procedures Procedures (including critical care time)  Medications Ordered in UC Medications - No data to display  Initial Impression / Assessment and Plan / UC Course  I have reviewed the triage vital signs and the nursing notes.  Pertinent labs & imaging results that were available during my care of the patient were reviewed by me and considered in my medical decision making (see chart for details).  45 year old female presenting for dysuria, urinary frequency/urgency, left flank pain, nausea, fatigue and body aches starting today.  She is afebrile.  On exam she does have left CVA tenderness and left lower quadrant as well as suprapubic tenderness.  She appears uncomfortable but not toxic.  Urinalysis performed today shows moderate blood, protein, positive nitrites and trace leukocytes.  We will send urine for culture and treat for  suspected ascending UTI at this time.  Reviewed patient's previous urine cultures which appear to be pansensitive.  We will treat her with Cipro again since she has not had that medication in 3 months and concern for kidney infection.  States that she tolerated the medication well.  I have also sent in Zofran.  Advise close monitoring and increasing rest and fluids.  ED precautions reviewed.  Advised her to contact her urologist for  appointment.   Final Clinical Impressions(s) / UC Diagnoses   Final diagnoses:  Acute pyelonephritis  Dysuria  Left flank pain  Nausea without vomiting     Discharge Instructions      You have a another urinary infection.  Since you have flank pain, fatigue and nausea with that and your history of recurrent UTIs, this is suspicious for the infection moving toward her kidneys.  I have sent in Cipro.  Have also sent in Zofran.  Increase rest and fluids.  If you develop a fever, worsening flank or abdominal pain, feel more fatigued or weak or any she go to the ER.  As we discussed, sometimes people have to be admitted for these infections.  You should start to be feeling better over the next couple of days.  Please call your urologist for an appointment.     ED Prescriptions     Medication Sig Dispense Auth. Provider   ciprofloxacin (CIPRO) 500 MG tablet Take 1 tablet (500 mg total) by mouth every 12 (twelve) hours for 7 days. 14 tablet Laurene Footman B, PA-C   ondansetron (ZOFRAN ODT) 4 MG disintegrating tablet Take 1 tablet (4 mg total) by mouth every 8 (eight) hours as needed for nausea or vomiting. 20 tablet Gretta Cool      PDMP not reviewed this encounter.   Danton Clap, PA-C 05/09/21 1350

## 2021-05-09 NOTE — ED Triage Notes (Signed)
Patient c/o burning sharp pain during urination and urinary frequency that started this morning.  Patient denies fevers.

## 2021-05-13 LAB — URINE CULTURE: Culture: >=100000 — AB

## 2021-06-05 ENCOUNTER — Other Ambulatory Visit: Payer: Self-pay

## 2021-06-05 ENCOUNTER — Ambulatory Visit
Admission: EM | Admit: 2021-06-05 | Discharge: 2021-06-05 | Disposition: A | Discharge status: Home / Self Care | Payer: BC Managed Care – PPO | Attending: Physician Assistant | Admitting: Physician Assistant

## 2021-06-05 DIAGNOSIS — B349 Viral infection, unspecified: Secondary | ICD-10-CM

## 2021-06-05 DIAGNOSIS — R319 Hematuria, unspecified: Secondary | ICD-10-CM | POA: Diagnosis present

## 2021-06-05 DIAGNOSIS — J45909 Unspecified asthma, uncomplicated: Secondary | ICD-10-CM | POA: Diagnosis not present

## 2021-06-05 DIAGNOSIS — N390 Urinary tract infection, site not specified: Secondary | ICD-10-CM | POA: Diagnosis present

## 2021-06-05 DIAGNOSIS — Z20822 Contact with and (suspected) exposure to covid-19: Secondary | ICD-10-CM | POA: Insufficient documentation

## 2021-06-05 DIAGNOSIS — R059 Cough, unspecified: Secondary | ICD-10-CM

## 2021-06-05 LAB — URINALYSIS, COMPLETE (UACMP) WITH MICROSCOPIC
Bilirubin Urine: NEGATIVE
Glucose, UA: NEGATIVE mg/dL
Ketones, ur: NEGATIVE mg/dL
Nitrite: POSITIVE — AB
Protein, ur: >300 mg/dL — AB
Specific Gravity, Urine: >1.03 — ABNORMAL HIGH (ref 1.005–1.030)
WBC, UA: >50 WBC/hpf (ref 0–5)
pH: 6 (ref 5.0–8.0)

## 2021-06-05 MED ORDER — LEVOFLOXACIN 750 MG PO TABS: 750.0000 mg | ORAL_TABLET | Freq: Every day | ORAL | 0 refills | Status: AC

## 2021-06-05 MED ORDER — PREDNISONE 20 MG PO TABS: 40.0000 mg | ORAL_TABLET | Freq: Every day | ORAL | 0 refills | Status: AC

## 2021-06-05 MED ORDER — PHENAZOPYRIDINE HCL 200 MG PO TABS: 200.0000 mg | ORAL_TABLET | Freq: Three times a day (TID) | ORAL | 0 refills | Status: DC | PRN

## 2021-06-05 MED ORDER — PSEUDOEPH-BROMPHEN-DM 30-2-10 MG/5ML PO SYRP: 10.0000 mL | ORAL_SOLUTION | Freq: Four times a day (QID) | ORAL | 0 refills | Status: AC | PRN

## 2021-06-05 NOTE — ED Triage Notes (Signed)
Pt c/o dysuria, urinary pressure and frequency that started today. Pt also c/o cough, nasal congestion and hoarseness since Wednesday. Pt has been taking Mucinex for these symptoms, reports this does help some.

## 2021-06-05 NOTE — Discharge Instructions (Addendum)
UTI: Based on either symptoms or urinalysis, you may have a urinary tract infection. We will send the urine for culture and call with results in a few days. Begin antibiotics at this time. Your symptoms should be much improved over the next 2-3 days. Increase rest and fluid intake. If for some reason symptoms are worsening or not improving after a couple of days or the urine culture determines the antibiotics you are taking will not treat the infection, the antibiotics may be changed. Return or go to ER for fever, back pain, worsening urinary pain, discharge, increased blood in urine. May take Tylenol or Motrin OTC for pain relief or consider AZO if no contraindications   URI/COLD SYMPTOMS: Your exam today is consistent with a viral illness. Antibiotics are not indicated at this time. Use medications as directed, including cough syrup, nasal saline, and decongestants. Your symptoms should improve over the next few days and resolve within 7-10 days. Increase rest and fluids. F/u if symptoms worsen or predominate such as sore throat, ear pain, productive cough, shortness of breath, or if you develop high fevers or worsening fatigue over the next several days.    You have received COVID testing today either for positive exposure, concerning symptoms that could be related to COVID infection, screening purposes, or re-testing after confirmed positive.  Your test obtained today checks for active viral infection in the last 1-2 weeks. If your test is negative now, you can still test positive later. So, if you do develop symptoms you should either get re-tested and/or isolate x 5 days and then strict mask use x 5 days (unvaccinated) or mask use x 10 days (vaccinated). Please follow CDC guidelines.  While Rapid antigen tests come back in 15-20 minutes, send out PCR/molecular test results typically come back within 1-3 days. In the mean time, if you are symptomatic, assume this could be a positive test and  treat/monitor yourself as if you do have COVID.   We will call with test results if positive. Please download the MyChart app and set up a profile to access test results.   If symptomatic, go home and rest. Push fluids. Take Tylenol as needed for discomfort. Gargle warm salt water. Throat lozenges. Take Mucinex DM or Robitussin for cough. Humidifier in bedroom to ease coughing. Warm showers. Also review the COVID handout for more information.  COVID-19 INFECTION: The incubation period of COVID-19 is approximately 14 days after exposure, with most symptoms developing in roughly 4-5 days. Symptoms may range in severity from mild to critically severe. Roughly 80% of those infected will have mild symptoms. People of any age may become infected with COVID-19 and have the ability to transmit the virus. The most common symptoms include: fever, fatigue, cough, body aches, headaches, sore throat, nasal congestion, shortness of breath, nausea, vomiting, diarrhea, changes in smell and/or taste.    COURSE OF ILLNESS Some patients may begin with mild disease which can progress quickly into critical symptoms. If your symptoms are worsening please call ahead to the Emergency Department and proceed there for further treatment. Recovery time appears to be roughly 1-2 weeks for mild symptoms and 3-6 weeks for severe disease.   GO IMMEDIATELY TO ER FOR FEVER YOU ARE UNABLE TO GET DOWN WITH TYLENOL, BREATHING PROBLEMS, CHEST PAIN, FATIGUE, LETHARGY, INABILITY TO EAT OR DRINK, ETC  QUARANTINE AND ISOLATION: To help decrease the spread of COVID-19 please remain isolated if you have COVID infection or are highly suspected to have COVID infection. This means -  stay home and isolate to one room in the home if you live with others. Do not share a bed or bathroom with others while ill, sanitize and wipe down all countertops and keep common areas clean and disinfected. Stay home for 5 days. If you have no symptoms or your symptoms  are resolving after 5 days, you can leave your house. Continue to wear a mask around others for 5 additional days. If you have been in close contact (within 6 feet) of someone diagnosed with COVID 19, you are advised to quarantine in your home for 14 days as symptoms can develop anywhere from 2-14 days after exposure to the virus. If you develop symptoms, you  must isolate.  Most current guidelines for COVID after exposure -unvaccinated: isolate 5 days and strict mask use x 5 days. Test on day 5 is possible -vaccinated: wear mask x 10 days if symptoms do not develop -You do not necessarily need to be tested for COVID if you have + exposure and  develop symptoms. Just isolate at home x10 days from symptom onset During this global pandemic, CDC advises to practice social distancing, try to stay at least 78f away from others at all times. Wear a face covering. Wash and sanitize your hands regularly and avoid going anywhere that is not necessary.  KEEP IN MIND THAT THE COVID TEST IS NOT 100% ACCURATE AND YOU SHOULD STILL DO EVERYTHING TO PREVENT POTENTIAL SPREAD OF VIRUS TO OTHERS (WEAR MASK, WEAR GLOVES, [place]S AND SANITIZE REGULARLY). IF INITIAL TEST IS NEGATIVE, THIS MAY NOT MEAN YOU ARE DEFINITELY NEGATIVE. MOST ACCURATE TESTING IS DONE 5-7 DAYS AFTER EXPOSURE.   It is not advised by CDC to get re-tested after receiving a positive COVID test since you can still test positive for weeks to months after you have already cleared the virus.   *If you have not been vaccinated for COVID, I strongly suggest you consider getting vaccinated as long as there are no contraindications.

## 2021-06-05 NOTE — ED Provider Notes (Signed)
[facility]    CSN: [id number] Arrival date & time: 06/05/21  1532      History   Chief Complaint Chief Complaint  Patient presents with   Dysuria   Cough    HPI Tiffany Andrade is a 45 y.o. female presenting with multiple complaints.  First she states she believes she has UTI.  States that today she started to have dysuria, urinary frequency and urgency as well as suprapubic pressure.  Patient recently treated by me 1 month ago for UTI.  Patient says symptoms resolved within a day or 2 when she had a repeat urinalysis performed by her primary care provider stated it was clean.  Patient denies any associated fevers.  She has had some bilateral flank pain.  Additionally she complains of 2-day history of voice hoarseness, cough, nasal congestion and mild sore throat.  Additionally she states that she feels a little short of breath at times.  She says she has reactive airway disease and whenever she gets sick she has bronchospasms.  Patient has been using her daily inhaler as well as albuterol without improvement in her symptoms.  She denies any known COVID exposure.  Vaccinated for COVID-19 x3.  Not taking any cough medication.  She has no other complaints or concerns.  HPI  Past Medical History:  Diagnosis Date   Anxiety    Depression    DVT (deep venous thrombosis) (HCC)    Hypertension    UTI (lower urinary tract infection)     There are no problems to display for this patient.   Past Surgical History:  Procedure Laterality Date   CESAREAN SECTION     x2   KIDNEY SURGERY     X2   TONSILLECTOMY     TUBAL LIGATION      OB History   No obstetric history on file.      Home Medications    Prior to Admission medications   Medication Sig Start Date End Date Taking? Authorizing Provider  albuterol (VENTOLIN HFA) 108 (90 Base) MCG/ACT inhaler Inhale into the lungs.   Yes [provider]  amLODipine (NORVASC) 5 MG tablet Take 5 mg by mouth daily.    Yes [provider]  B Complex-C-Calcium (GNP B-COMPLEX PLUS VITAMIN C) TABS Take by mouth.   Yes [provider]  beclomethasone (QVAR REDIHALER) 40 MCG/ACT inhaler Inhale into the lungs. 01/18/21  Yes [provider]  brompheniramine-pseudoephedrine-DM 30-2-10 MG/5ML syrup Take 10 mLs by mouth 4 (four) times daily as needed for up to 7 days. 06/05/21 06/12/21 Yes Danton Clap, PA-C  busPIRone (BUSPAR) 10 MG tablet Take 10 mg by mouth 2 (two) times daily. 11/09/19  Yes [provider]  candesartan (ATACAND) 32 MG tablet Take by mouth. 12/26/20  Yes [provider]  cetirizine (ZYRTEC) 10 MG tablet Take 10 mg by mouth daily. 03/19/21  Yes [provider]  chlorthalidone (HYGROTON) 25 MG tablet Take 12.5 mg by mouth daily.   Yes [provider]  cyclobenzaprine (FLEXERIL) 10 MG tablet Take 10 mg by mouth at bedtime as needed. 12/23/19  Yes [provider]  fluticasone (FLONASE) 50 MCG/ACT nasal spray Place 1 spray into both nostrils daily. 05/20/20  Yes [provider]  Galcanezumab-gnlm 120 MG/ML SOAJ Inject into the skin. 12/24/20  Yes [provider]  hydrOXYzine (VISTARIL) 25 MG capsule Take 25-50 mg by mouth 2 (two) times daily as needed. 01/23/21  Yes [provider]  levofloxacin (LEVAQUIN)  750 MG tablet Take 1 tablet (750 mg total) by mouth daily for 5 days. 06/05/21 06/10/21 Yes Laurene Footman B, PA-C  lidocaine (LIDODERM) 5 % SMARTSIG:Patch(s) Topical 03/06/21  Yes [provider]  MAGNESIUM PO Take by mouth.   Yes [provider]  meloxicam (MOBIC) 15 MG tablet Take 15 mg by mouth 2 (two) times daily. 09/14/19  Yes [provider]  metaxalone (SKELAXIN) 800 MG tablet TAKE 1 TABLET BY MOUTH 2 TIMES DAILY AS NEEDED FOR PAIN. 12/10/20  Yes [provider]  nystatin ointment (MYCOSTATIN) Apply topically.   Yes [provider]  Olopatadine HCl 0.2 % SOLN Apply 1 drop  to eye daily. 05/12/20  Yes [provider]  OZEMPIC, 0.25 OR 0.5 MG/DOSE, 2 MG/1.5ML SOPN Inject into the skin. 05/20/21  Yes [provider]  phenazopyridine (PYRIDIUM) 200 MG tablet Take 1 tablet (200 mg total) by mouth 3 (three) times daily as needed for pain. 06/05/21  Yes Laurene Footman B, PA-C  potassium chloride (K-DUR,KLOR-CON) 10 MEQ tablet Take 10 mEq by mouth 2 (two) times daily.   Yes [provider]  predniSONE (DELTASONE) 20 MG tablet Take 2 tablets (40 mg total) by mouth daily for 5 days. 06/05/21 06/10/21 Yes Laurene Footman B, PA-C  rizatriptan (MAXALT-MLT) 10 MG disintegrating tablet Give one tablet may repeat in 2 hours if needed.  No more than 4 in 1 week 04/14/18  Yes [provider]  sertraline (ZOLOFT) 100 MG tablet Take 1 tablet by mouth daily. 03/20/21  Yes [provider]  traZODone (DESYREL) 50 MG tablet prn 10/17/19  Yes [provider]  gabapentin (NEURONTIN) 300 MG capsule Take 3 capsules by mouth 3 (three) times daily. 12/15/18 04/13/21  [provider]  mupirocin nasal ointment (BACTROBAN) 2 % Apply to wound 3 times a day. 09/20/20   Margarette Canada, NP  ondansetron (ZOFRAN ODT) 4 MG disintegrating tablet Take 1 tablet (4 mg total) by mouth every 8 (eight) hours as needed for nausea or vomiting. 05/09/21   Danton Clap, PA-C  Spacer/Aero-Holding Chambers (AEROCHAMBER PLUS) inhaler Use as instructed 11/14/19   Melynda Ripple, MD  amitriptyline (ELAVIL) 25 MG tablet Take 25 mg by mouth at bedtime as needed for sleep.  11/14/19  [provider]  lisinopril-hydrochlorothiazide (PRINZIDE,ZESTORETIC) 20-25 MG tablet Take 1 tablet by mouth daily.  02/02/21  [provider]  POTASSIUM PO Take by mouth.  09/20/20  [provider]  topiramate (TOPAMAX) 25 MG tablet Please start at '25mg'$  nightly for a week. You may increase to '25mg'$  twice a day a week later if you are tolerating this well. You can increase to  three times per day a week later if you are still tolerating this well. 05/01/20 09/20/20  [provider]    Family History Family History  Problem Relation Age of Onset   Hypertension Mother    Hypertension Sister    Hypertension Brother     Social History Social History   Tobacco Use   Smoking status: Never   Smokeless tobacco: Never  Vaping Use   Vaping Use: Never used  Substance Use Topics   Alcohol use: No    Alcohol/week: 0.0 standard drinks   Drug use: No     Allergies   Codeine, Ivp dye [iodinated diagnostic agents], Sulfa antibiotics, and Tape   Review of Systems Review of Systems  Constitutional:  Positive for fatigue. Negative for chills, diaphoresis and fever.  HENT:  Positive for congestion,  rhinorrhea, sore throat and voice change. Negative for ear pain, sinus pressure and sinus pain.   Respiratory:  Positive for cough and shortness of breath.   Cardiovascular:  Negative for chest pain.  Gastrointestinal:  Positive for abdominal pain. Negative for diarrhea, nausea and vomiting.  Genitourinary:  Positive for dysuria, frequency and urgency. Negative for decreased urine volume, flank pain, hematuria, pelvic pain, vaginal bleeding, vaginal discharge and vaginal pain.  Musculoskeletal:  Positive for back pain. Negative for arthralgias and myalgias.  Skin:  Negative for rash.  Neurological:  Negative for weakness and headaches.  Hematological:  Negative for adenopathy.    Physical Exam Triage Vital Signs ED Triage Vitals  Enc Vitals Group     BP 06/05/21 1558 125/85     Pulse Rate 06/05/21 1558 77     Resp 06/05/21 1558 18     Temp 06/05/21 1558 98.2 F (36.8 C)     Temp Source 06/05/21 1558 Oral     SpO2 06/05/21 1558 100 %     Weight 06/05/21 1555 203 lb (92.1 kg)     Height 06/05/21 1555 4' 11.02" (1.499 m)     Head Circumference --      Peak Flow --      Pain Score 06/05/21 1555 9     Pain Loc --      Pain Edu? --      Excl. in [place]? --     No data found.  Updated Vital Signs BP 125/85 (BP Location: Left Arm)   Pulse 77   Temp 98.2 F (36.8 C) (Oral)   Resp 18   Ht 4' 11.02" (1.499 m)   Wt 203 lb (92.1 kg)   LMP 10/25/2016   SpO2 100%   BMI 40.98 kg/m      Physical Exam Vitals and nursing note reviewed.  Constitutional:      General: She is not in acute distress.    Appearance: Normal appearance. She is not ill-appearing or toxic-appearing.  HENT:     Head: Normocephalic and atraumatic.     Right Ear: Tympanic membrane, ear canal and external ear normal.     Left Ear: Ear canal and external ear normal. Tympanic membrane is injected and erythematous.     Nose: Congestion present.     Mouth/Throat:     Mouth: Mucous membranes are moist.     Pharynx: Oropharynx is clear. Posterior oropharyngeal erythema present.  Eyes:     General: No scleral icterus.       Right eye: No discharge.        Left eye: No discharge.     Conjunctiva/sclera: Conjunctivae normal.  Cardiovascular:     Rate and Rhythm: Normal rate and regular rhythm.     Heart sounds: Normal heart sounds.  Pulmonary:     Effort: Pulmonary effort is normal. No respiratory distress.     Breath sounds: Normal breath sounds.  Abdominal:     Palpations: Abdomen is soft.     Tenderness: There is abdominal tenderness (suprapubic). There is right CVA tenderness and left CVA tenderness.  Musculoskeletal:     Cervical back: Neck supple.  Skin:    General: Skin is dry.  Neurological:     General: No focal deficit present.     Mental Status: She is alert. Mental status is at baseline.     Motor: No weakness.     Gait: Gait normal.  Psychiatric:        Mood and  Affect: Mood normal.        Behavior: Behavior normal.        Thought Content: Thought content normal.     UC Treatments / Results  Labs (all labs ordered are listed, but only abnormal results are displayed) Labs Reviewed  URINALYSIS, COMPLETE (UACMP) WITH MICROSCOPIC - Abnormal; Notable  for the following components:      Result Value   APPearance CLOUDY (*)    Specific Gravity, Urine >1.030 (*)    Hgb urine dipstick MODERATE (*)    Protein, ur >300 (*)    Nitrite POSITIVE (*)    Leukocytes,Ua SMALL (*)    Bacteria, UA MANY (*)    All other components within normal limits  URINE CULTURE  SARS CORONAVIRUS 2 (TAT 6-24 HRS)    EKG   Radiology No results found.  Procedures Procedures (including critical care time)  Medications Ordered in UC Medications - No data to display  Initial Impression / Assessment and Plan / UC Course  I have reviewed the triage vital signs and the nursing notes.  Pertinent labs & imaging results that were available during my care of the patient were reviewed by me and considered in my medical decision making (see chart for details).  45 year old female presenting for dysuria, urinary frequency and urgency starting today.  No associated fevers but she has had some lower abdominal cramping and bilateral flank pain.  Recently treated for UTI last month by me.  Urine culture did grow Klebsiella.  She was treated with Cipro at that time.  Urinalysis today is similar to what it was last month.  Specific gravity greater than 1.030, moderate blood, greater than 300 protein, positive nitrites, small leukocytes and many bacteria.  We will send urine for culture and treat her again.  Suspect possible ascending infection given bilateral CVA tenderness.  Since she had improvement with fluoroquinolone last time and does have Bactrim allergy at this time and questionable ampicillin sensitivity based on last culture, will treat with Levaquin.  Also advised to increase rest and fluids and reviewed ED precautions for UTIs with patient.  Additionally complaining of 2-day history of cough, congestion and shortness of breath.  She is currently afebrile and oxygen 100%.  Her chest is clear to auscultation heart regular rate and rhythm but she does have some mild  posterior pharyngeal erythema and postnasal drainage as well as mild nasal congestion.  COVID test obtained.  Current CDC guidelines, isolation protocol and ED precautions reviewed with patient.  Advised patient if she is positive for COVID-19 someone will contact her.  If she is positive she is a candidate for antiviral therapy we can discuss that further once we get the test results.  In the meantime, advised her this is likely viral illness and supportive care encouraged with increasing rest and fluids.  I did send prednisone to help with her bronchospasm and Bromfed DM for cough.  Thoroughly reviewed ED red flag signs and symptoms for viral illnesses.  Advised to continue using at home inhalers.  Follow-up with [place] as needed.  Work note given.  Final Clinical Impressions(s) / UC Diagnoses   Final diagnoses:  Urinary tract infection with hematuria, site unspecified  Viral illness  Cough  Reactive airway disease without complication, unspecified asthma severity, unspecified whether persistent     Discharge Instructions      UTI: Based on either symptoms or urinalysis, you may have a urinary tract infection. We will send the urine for culture and  call with results in a few days. Begin antibiotics at this time. Your symptoms should be much improved over the next 2-3 days. Increase rest and fluid intake. If for some reason symptoms are worsening or not improving after a couple of days or the urine culture determines the antibiotics you are taking will not treat the infection, the antibiotics may be changed. Return or go to ER for fever, back pain, worsening urinary pain, discharge, increased blood in urine. May take Tylenol or Motrin OTC for pain relief or consider AZO if no contraindications   URI/COLD SYMPTOMS: Your exam today is consistent with a viral illness. Antibiotics are not indicated at this time. Use medications as directed, including cough syrup, nasal saline, and decongestants. Your  symptoms should improve over the next few days and resolve within 7-10 days. Increase rest and fluids. F/u if symptoms worsen or predominate such as sore throat, ear pain, productive cough, shortness of breath, or if you develop high fevers or worsening fatigue over the next several days.    You have received COVID testing today either for positive exposure, concerning symptoms that could be related to COVID infection, screening purposes, or re-testing after confirmed positive.  Your test obtained today checks for active viral infection in the last 1-2 weeks. If your test is negative now, you can still test positive later. So, if you do develop symptoms you should either get re-tested and/or isolate x 5 days and then strict mask use x 5 days (unvaccinated) or mask use x 10 days (vaccinated). Please follow CDC guidelines.  While Rapid antigen tests come back in 15-20 minutes, send out PCR/molecular test results typically come back within 1-3 days. In the mean time, if you are symptomatic, assume this could be a positive test and treat/monitor yourself as if you do have COVID.   We will call with test results if positive. Please download the MyChart app and set up a profile to access test results.   If symptomatic, go home and rest. Push fluids. Take Tylenol as needed for discomfort. Gargle warm salt water. Throat lozenges. Take Mucinex DM or Robitussin for cough. Humidifier in bedroom to ease coughing. Warm showers. Also review the COVID handout for more information.  COVID-19 INFECTION: The incubation period of COVID-19 is approximately 14 days after exposure, with most symptoms developing in roughly 4-5 days. Symptoms may range in severity from mild to critically severe. Roughly 80% of those infected will have mild symptoms. People of any age may become infected with COVID-19 and have the ability to transmit the virus. The most common symptoms include: fever, fatigue, cough, body aches, headaches, sore  throat, nasal congestion, shortness of breath, nausea, vomiting, diarrhea, changes in smell and/or taste.    COURSE OF ILLNESS Some patients may begin with mild disease which can progress quickly into critical symptoms. If your symptoms are worsening please call ahead to the Emergency Department and proceed there for further treatment. Recovery time appears to be roughly 1-2 weeks for mild symptoms and 3-6 weeks for severe disease.   GO IMMEDIATELY TO ER FOR FEVER YOU ARE UNABLE TO GET DOWN WITH TYLENOL, BREATHING PROBLEMS, CHEST PAIN, FATIGUE, LETHARGY, INABILITY TO EAT OR DRINK, ETC  QUARANTINE AND ISOLATION: To help decrease the spread of COVID-19 please remain isolated if you have COVID infection or are highly suspected to have COVID infection. This means -stay home and isolate to one room in the home if you live with others. Do not share a bed or  bathroom with others while ill, sanitize and wipe down all countertops and keep common areas clean and disinfected. Stay home for 5 days. If you have no symptoms or your symptoms are resolving after 5 days, you can leave your house. Continue to wear a mask around others for 5 additional days. If you have been in close contact (within 6 feet) of someone diagnosed with COVID 19, you are advised to quarantine in your home for 14 days as symptoms can develop anywhere from 2-14 days after exposure to the virus. If you develop symptoms, you  must isolate.  Most current guidelines for COVID after exposure -unvaccinated: isolate 5 days and strict mask use x 5 days. Test on day 5 is possible -vaccinated: wear mask x 10 days if symptoms do not develop -You do not necessarily need to be tested for COVID if you have + exposure and  develop symptoms. Just isolate at home x10 days from symptom onset During this global pandemic, CDC advises to practice social distancing, try to stay at least 40f away from others at all times. Wear a face covering. Wash and sanitize your  hands regularly and avoid going anywhere that is not necessary.  KEEP IN MIND THAT THE COVID TEST IS NOT 100% ACCURATE AND YOU SHOULD STILL DO EVERYTHING TO PREVENT POTENTIAL SPREAD OF VIRUS TO OTHERS (WEAR MASK, WEAR GLOVES, [place] AND SANITIZE REGULARLY). IF INITIAL TEST IS NEGATIVE, THIS MAY NOT MEAN YOU ARE DEFINITELY NEGATIVE. MOST ACCURATE TESTING IS DONE 5-7 DAYS AFTER EXPOSURE.   It is not advised by CDC to get re-tested after receiving a positive COVID test since you can still test positive for weeks to months after you have already cleared the virus.   *If you have not been vaccinated for COVID, I strongly suggest you consider getting vaccinated as long as there are no contraindications.     ED Prescriptions     Medication Sig Dispense Auth. Provider   phenazopyridine (PYRIDIUM) 200 MG tablet Take 1 tablet (200 mg total) by mouth 3 (three) times daily as needed for pain. 6 tablet ELaurene FootmanB, PA-C   levofloxacin (LEVAQUIN) 750 MG tablet Take 1 tablet (750 mg total) by mouth daily for 5 days. 5 tablet ELaurene FootmanB, PA-C   predniSONE (DELTASONE) 20 MG tablet Take 2 tablets (40 mg total) by mouth daily for 5 days. 10 tablet ELaurene FootmanB, PA-C   brompheniramine-pseudoephedrine-DM 30-2-10 MG/5ML syrup Take 10 mLs by mouth 4 (four) times daily as needed for up to 7 days. 150 mL EDanton Clap PA-C      PDMP not reviewed this encounter.   EDanton Clap PA-C 06/05/21 1708

## 2021-06-06 LAB — SARS CORONAVIRUS 2 (TAT 6-24 HRS): SARS Coronavirus 2: NEGATIVE

## 2021-06-08 LAB — URINE CULTURE: Culture: >=100000 — AB

## 2021-09-11 DIAGNOSIS — F331 Major depressive disorder, recurrent, moderate: Secondary | ICD-10-CM | POA: Insufficient documentation

## 2021-09-11 DIAGNOSIS — F4310 Post-traumatic stress disorder, unspecified: Secondary | ICD-10-CM | POA: Insufficient documentation

## 2021-10-07 ENCOUNTER — Other Ambulatory Visit: Payer: Self-pay

## 2021-10-07 ENCOUNTER — Encounter: Payer: Self-pay | Admitting: Emergency Medicine

## 2021-10-07 ENCOUNTER — Ambulatory Visit
Admission: EM | Admit: 2021-10-07 | Discharge: 2021-10-07 | Disposition: A | Discharge status: Home / Self Care | Payer: BC Managed Care – PPO | Attending: Medical Oncology | Admitting: Medical Oncology

## 2021-10-07 ENCOUNTER — Ambulatory Visit (INDEPENDENT_AMBULATORY_CARE_PROVIDER_SITE_OTHER): Payer: BC Managed Care – PPO

## 2021-10-07 DIAGNOSIS — M79645 Pain in left finger(s): Secondary | ICD-10-CM | POA: Diagnosis not present

## 2021-10-07 DIAGNOSIS — M25532 Pain in left wrist: Secondary | ICD-10-CM

## 2021-10-07 MED ORDER — MELOXICAM 15 MG PO TABS: 15.0000 mg | ORAL_TABLET | Freq: Every day | ORAL | 0 refills | Status: DC

## 2021-10-07 NOTE — ED Provider Notes (Addendum)
[facility]    CSN: [id number] Arrival date & time: 10/07/21  1422      History   Chief Complaint Chief Complaint  Patient presents with   Wrist Injury    HPI Tiffany Andrade is a 45 y.o. female.   HPI  Wrist Pain: Patient reports that 5 days ago she was walking her dogs when she fell on her left hand in a [place] nature.  She reports that this was moderately uncomfortable and has slightly worsened with time.  She figured that she first had a sprain so she tried to use Tylenol or ibuprofen without much improvement.  She notes mild swelling in the hand around the thumb area which is where most of her pain is.  She states that she is not able to grip things due to pain.  She is also not able to move her wrist side to side or move her wrist in a flexion nature without pain.  Unknown if she has had previous injuries to this area as she states that she has had many falls due to the dogs. No head injury or LOC.   Past Medical History:  Diagnosis Date   Anxiety    Depression    DVT (deep venous thrombosis) (HCC)    Hypertension    UTI (lower urinary tract infection)     There are no problems to display for this patient.   Past Surgical History:  Procedure Laterality Date   CESAREAN SECTION     x2   KIDNEY SURGERY     X2   TONSILLECTOMY     TUBAL LIGATION      OB History   No obstetric history on file.      Home Medications    Prior to Admission medications   Medication Sig Start Date End Date Taking? Authorizing Provider  meloxicam (MOBIC) 15 MG tablet Take 1 tablet (15 mg total) by mouth daily. 10/07/21  Yes Helon Wisinski M, PA-C  albuterol (VENTOLIN HFA) 108 (90 Base) MCG/ACT inhaler Inhale into the lungs.    [provider]  amLODipine (NORVASC) 5 MG tablet Take 5 mg by mouth daily.    [provider]  B Complex-C-Calcium (GNP B-COMPLEX PLUS VITAMIN C) TABS Take by mouth.    [provider]  beclomethasone (QVAR  REDIHALER) 40 MCG/ACT inhaler Inhale into the lungs. 01/18/21   [provider]  busPIRone (BUSPAR) 10 MG tablet Take 10 mg by mouth 2 (two) times daily. 11/09/19   [provider]  candesartan (ATACAND) 32 MG tablet Take by mouth. 12/26/20   [provider]  cetirizine (ZYRTEC) 10 MG tablet Take 10 mg by mouth daily. 03/19/21   [provider]  chlorthalidone (HYGROTON) 25 MG tablet Take 12.5 mg by mouth daily.    [provider]  cyclobenzaprine (FLEXERIL) 10 MG tablet Take 10 mg by mouth at bedtime as needed. 12/23/19   [provider]  fluticasone (FLONASE) 50 MCG/ACT nasal spray Place 1 spray into both nostrils daily. 05/20/20   [provider]  gabapentin (NEURONTIN) 300 MG capsule Take 3 capsules by mouth 3 (three) times daily. 12/15/18 04/13/21  [provider]  Galcanezumab-gnlm 120 MG/ML SOAJ Inject into the skin. 12/24/20   [provider]  hydrOXYzine (VISTARIL) 25 MG capsule Take 25-50 mg by mouth 2 (two) times daily as needed. 01/23/21   [provider]  lidocaine (LIDODERM) 5 % SMARTSIG:Patch(s) Topical 03/06/21   [provider]  MAGNESIUM  PO Take by mouth.    [provider]  meloxicam (MOBIC) 15 MG tablet Take 1 tablet (15 mg total) by mouth daily. 10/07/21   Hughie Closs, PA-C  metaxalone (SKELAXIN) 800 MG tablet TAKE 1 TABLET BY MOUTH 2 TIMES DAILY AS NEEDED FOR PAIN. 12/10/20   [provider]  mupirocin nasal ointment (BACTROBAN) 2 % Apply to wound 3 times a day. 09/20/20   Margarette Canada, NP  nystatin ointment (MYCOSTATIN) Apply topically.    [provider]  Olopatadine HCl 0.2 % SOLN Apply 1 drop to eye daily. 05/12/20   [provider]  ondansetron (ZOFRAN ODT) 4 MG disintegrating tablet Take 1 tablet (4 mg total) by mouth every 8 (eight) hours as needed for nausea or vomiting. 05/09/21   Laurene Footman B, PA-C  OZEMPIC, 0.25 OR 0.5 MG/DOSE, 2 MG/1.5ML  SOPN Inject into the skin. 05/20/21   [provider]  phenazopyridine (PYRIDIUM) 200 MG tablet Take 1 tablet (200 mg total) by mouth 3 (three) times daily as needed for pain. 06/05/21   Laurene Footman B, PA-C  potassium chloride (K-DUR,KLOR-CON) 10 MEQ tablet Take 10 mEq by mouth 2 (two) times daily.    [provider]  rizatriptan (MAXALT-MLT) 10 MG disintegrating tablet Give one tablet may repeat in 2 hours if needed.  No more than 4 in 1 week 04/14/18   [provider]  sertraline (ZOLOFT) 100 MG tablet Take 1 tablet by mouth daily. 03/20/21   [provider]  Spacer/Aero-Holding Chambers (AEROCHAMBER PLUS) inhaler Use as instructed 11/14/19   Melynda Ripple, MD  traZODone (DESYREL) 50 MG tablet prn 10/17/19   [provider]  amitriptyline (ELAVIL) 25 MG tablet Take 25 mg by mouth at bedtime as needed for sleep.  11/14/19  [provider]  lisinopril-hydrochlorothiazide (PRINZIDE,ZESTORETIC) 20-25 MG tablet Take 1 tablet by mouth daily.  02/02/21  [provider]  POTASSIUM PO Take by mouth.  09/20/20  [provider]  topiramate (TOPAMAX) 25 MG tablet Please start at 25mg  nightly for a week. You may increase to 25mg  twice a day a week later if you are tolerating this well. You can increase to three times per day a week later if you are still tolerating this well. 05/01/20 09/20/20  [provider]    Family History Family History  Problem Relation Age of Onset   Hypertension Mother    Hypertension Sister    Hypertension Brother     Social History Social History   Tobacco Use   Smoking status: Never   Smokeless tobacco: Never  Vaping Use   Vaping Use: Never used  Substance Use Topics   Alcohol use: No    Alcohol/week: 0.0 standard drinks   Drug use: No     Allergies   Levofloxacin, Oxybutynin, Codeine, Ivp dye [iodinated diagnostic agents], Other, Sulfa antibiotics, and Tape   Review of  Systems Review of Systems  As stated above in HPI Physical Exam Triage Vital Signs ED Triage Vitals  Enc Vitals Group     BP 10/07/21 1520 (!) 142/100     Pulse Rate 10/07/21 1520 80     Resp 10/07/21 1520 18     Temp 10/07/21 1520 98.2 F (36.8 C)     Temp Source 10/07/21 1520 Oral     SpO2 10/07/21 1520 99 %     Weight --      Height --      Head Circumference --  Peak Flow --      Pain Score 10/07/21 1518 6     Pain Loc --      Pain Edu? --      Excl. in [place]? --    No data found.  Updated Vital Signs BP (!) 142/100 (BP Location: Right Arm)   Pulse 80   Temp 98.2 F (36.8 C) (Oral)   Resp 18   LMP 10/25/2016   SpO2 99%   Physical Exam Vitals and nursing note reviewed.  Constitutional:      General: She is not in acute distress.    Appearance: Normal appearance. She is not ill-appearing, toxic-appearing or diaphoretic.  Cardiovascular:     Pulses: Normal pulses.  Musculoskeletal:        General: Swelling (mild of the left thumb) and tenderness (mild left thumb) present.     Comments: Unable to perform side to side movement of the wrist. Pain with flexion of left wrist  Skin:    General: Skin is warm.     Findings: No bruising or rash.  Neurological:     Mental Status: She is alert.     UC Treatments / Results  Labs (all labs ordered are listed, but only abnormal results are displayed) Labs Reviewed - No data to display  EKG   Radiology DG Finger Thumb Left  Result Date: 10/07/2021 CLINICAL DATA:  Left thumb pain after injury 5 days ago. EXAM: LEFT THUMB 2+V COMPARISON:  None. FINDINGS: There is no evidence of fracture or dislocation. There is no evidence of arthropathy or other focal bone abnormality. Soft tissues are unremarkable. IMPRESSION: Negative. Electronically Signed   By: Marijo Conception M.D.   On: 10/07/2021 16:38    Procedures Procedures (including critical care time)  Medications Ordered in UC Medications - No data to  display  Initial Impression / Assessment and Plan / UC Course  I have reviewed the triage vital signs and the nursing notes.  Pertinent labs & imaging results that were available during my care of the patient were reviewed by me and considered in my medical decision making (see chart for details).     New. X ray is normal. Likely sprain or possible tear of soft tissue. Wrist brace and NSAIDs x 2 weeks. Has tolerated Mobic well in the past- discussed precautions. If unimproved I would recommend orthopedic follow up.   Final Clinical Impressions(s) / UC Diagnoses   Final diagnoses:  Pain of left thumb  Left wrist pain   Discharge Instructions   None    ED Prescriptions     Medication Sig Dispense Auth. Provider   meloxicam (MOBIC) 15 MG tablet Take 1 tablet (15 mg total) by mouth daily. 30 tablet Madina Galati M, PA-C   meloxicam (MOBIC) 15 MG tablet Take 1 tablet (15 mg total) by mouth daily. 30 tablet Hughie Closs, [place]      PDMP not reviewed this encounter.   Hughie Closs, PA-C 10/07/21 1651    Hughie Closs, PA-C 10/07/21 1654

## 2021-10-07 NOTE — ED Triage Notes (Signed)
Pt tripped over her dogs 5 days ago and landed on her left wrist. She has pain from the wrist going up to her shoulder.

## 2021-12-21 DIAGNOSIS — G4700 Insomnia, unspecified: Secondary | ICD-10-CM | POA: Insufficient documentation

## 2022-02-25 ENCOUNTER — Ambulatory Visit (INDEPENDENT_AMBULATORY_CARE_PROVIDER_SITE_OTHER)
Admit: 2022-02-25 | Discharge: 2022-02-25 | Disposition: A | Discharge status: Home / Self Care | Payer: BC Managed Care – PPO | Attending: Internal Medicine | Admitting: Internal Medicine

## 2022-02-25 ENCOUNTER — Ambulatory Visit
Admission: EM | Admit: 2022-02-25 | Discharge: 2022-02-25 | Disposition: A | Discharge status: Home / Self Care | Payer: BC Managed Care – PPO | Attending: Emergency Medicine | Admitting: Emergency Medicine

## 2022-02-25 DIAGNOSIS — N281 Cyst of kidney, acquired: Secondary | ICD-10-CM

## 2022-02-25 DIAGNOSIS — R1032 Left lower quadrant pain: Secondary | ICD-10-CM

## 2022-02-25 LAB — URINALYSIS, ROUTINE W REFLEX MICROSCOPIC
Bilirubin Urine: NEGATIVE
Glucose, UA: NEGATIVE mg/dL
Hgb urine dipstick: NEGATIVE
Ketones, ur: NEGATIVE mg/dL
Leukocytes,Ua: NEGATIVE
Nitrite: NEGATIVE
Protein, ur: NEGATIVE mg/dL
Specific Gravity, Urine: <1.005 — ABNORMAL LOW (ref 1.005–1.030)
pH: 5.5 (ref 5.0–8.0)

## 2022-02-25 LAB — CBC WITH DIFFERENTIAL/PLATELET
Abs Immature Granulocytes: 0.06 10*3/uL (ref 0.00–0.07)
Basophils Absolute: 0.1 10*3/uL (ref 0.0–0.1)
Basophils Relative: 1 %
Eosinophils Absolute: 0.2 10*3/uL (ref 0.0–0.5)
Eosinophils Relative: 2 %
HCT: 45 % (ref 36.0–46.0)
Hemoglobin: 15 g/dL (ref 12.0–15.0)
Immature Granulocytes: 1 %
Lymphocytes Relative: 24 %
Lymphs Abs: 2.4 10*3/uL (ref 0.7–4.0)
MCH: 27.3 pg (ref 26.0–34.0)
MCHC: 33.3 g/dL (ref 30.0–36.0)
MCV: 81.8 fL (ref 80.0–100.0)
Monocytes Absolute: 0.7 10*3/uL (ref 0.1–1.0)
Monocytes Relative: 6 %
Neutro Abs: 6.8 10*3/uL (ref 1.7–7.7)
Neutrophils Relative %: 66 %
Platelets: 264 10*3/uL (ref 150–400)
RBC: 5.5 MIL/uL — ABNORMAL HIGH (ref 3.87–5.11)
RDW: 14.4 % (ref 11.5–15.5)
WBC: 10.1 10*3/uL (ref 4.0–10.5)
nRBC: 0 % (ref 0.0–0.2)

## 2022-02-25 LAB — COMPREHENSIVE METABOLIC PANEL
ALT: 14 U/L (ref 0–44)
AST: 12 U/L — ABNORMAL LOW (ref 15–41)
Albumin: 4.1 g/dL (ref 3.5–5.0)
Alkaline Phosphatase: 73 U/L (ref 38–126)
Anion gap: 7 (ref 5–15)
BUN: 19 mg/dL (ref 6–20)
CO2: 25 mmol/L (ref 22–32)
Calcium: 9 mg/dL (ref 8.9–10.3)
Chloride: 104 mmol/L (ref 98–111)
Creatinine, Ser: 0.57 mg/dL (ref 0.44–1.00)
GFR, Estimated: >60 mL/min (ref >60)
Glucose, Bld: 85 mg/dL (ref 70–99)
Potassium: 3.1 mmol/L — ABNORMAL LOW (ref 3.5–5.1)
Sodium: 136 mmol/L (ref 135–145)
Total Bilirubin: 0.5 mg/dL (ref 0.3–1.2)
Total Protein: 7.3 g/dL (ref 6.5–8.1)

## 2022-02-25 MED ORDER — HYDROCODONE-ACETAMINOPHEN 5-325 MG PO TABS: 1.0000 | ORAL_TABLET | Freq: Four times a day (QID) | ORAL | 0 refills | Status: DC | PRN

## 2022-02-25 NOTE — ED Notes (Signed)
Called pt insurance and no prior auth or pre cert is required. Ref # [id number] ?

## 2022-02-25 NOTE — Discharge Instructions (Addendum)
Your cat scan only shows a left kidney cyst, the rest is normal ?Your blood work is normal.  ?Your urine is negative for infection ?You need to follow up with a nephrologist next week.  ?If your pain, gets worse, go to ER.  ?

## 2022-02-25 NOTE — ED Provider Notes (Signed)
[facility]    CSN: [id number] Arrival date & time: 02/25/22  1208      History   Chief Complaint Chief Complaint  Patient presents with   Abdominal Pain    HPI Tiffany Andrade is a 46 y.o. female who presents with sever LLQ pain x 3 days.  Walking and going over bumps in the car provoke her pain. Has past hx of ovarian cyst. Denies diarrhea, constipation, n/v or fever.  Had colonoscopy 4 years ago and showed polyps.  Has had a hysterectomy.  Denies UTI symptoms.    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   Right leg pain 04/14/2018   Migraine without aura and without status migrainosus, not intractable 06/03/2017   Chronic cystitis 04/06/2017   Hematuria, microscopic 04/06/2017   Incomplete emptying of bladder 04/06/2017   Ureteral mass 04/06/2017   Unilateral ureteral reflux 04/06/2017   Right arm pain 11/12/2013   Anemia 09/25/2013   Constipation 09/25/2013   Dysmenorrhea 09/25/2013   Menorrhagia 09/25/2013   Hematochezia 05/08/2013   Obstructive sleep apnea 12/26/2012   Essential (primary) hypertension 10/19/2012   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      Home Medications    Prior to Admission medications   Medication Sig Start Date End Date Taking? Authorizing Provider  amLODipine (NORVASC) 5 MG tablet Take by mouth. 09/26/18  Yes [provider]  busPIRone (BUSPAR) 10 MG tablet Take 5 mg by mouth 2 (two) times daily. 11/09/19  Yes [provider]  candesartan (ATACAND) 32 MG tablet Take by mouth. 12/26/20  Yes [provider]  cetirizine (ZYRTEC) 10 MG tablet Take 10 mg by mouth daily. 03/19/21  Yes [provider]  cyclobenzaprine (FLEXERIL) 10 MG tablet Take 10 mg by mouth at bedtime as needed. 12/23/19  Yes [provider]  Galcanezumab-gnlm 120 MG/ML SOAJ Inject into the skin. 12/29/21  Yes [provider]  HYDROcodone-acetaminophen (NORCO/VICODIN) 5-325 MG tablet Take 1 tablet by mouth every 6 (six) hours as needed. 02/25/22  Yes Rodriguez-Southworth, Nettie Elm, PA-C  hydrOXYzine (VISTARIL) 25 MG capsule Take 25-50 mg by mouth 2 (two) times daily as needed. 01/23/21  Yes [provider]  MAGNESIUM PO Take by mouth.   Yes [provider]  meloxicam (MOBIC) 15 MG tablet Take by mouth. 09/14/19  Yes [provider]  methocarbamol (ROBAXIN) 500 MG tablet Take 500 mg by mouth 2 (two) times daily as needed. 01/31/22  Yes [provider]  OZEMPIC, 0.25 OR 0.5 MG/DOSE, 2 MG/1.5ML SOPN Inject into the skin. 05/20/21  Yes [provider]  polyethylene glycol powder (GLYCOLAX/MIRALAX) 17 GM/SCOOP powder Take by mouth.   Yes [provider]  POTASSIUM PO Take by mouth.   Yes [provider]  rizatriptan (MAXALT-MLT) 10 MG disintegrating tablet Give one tablet may repeat in 2 hours if needed.  No more than 4 in 1 week 04/14/18  Yes [provider]  sertraline (ZOLOFT) 100 MG tablet Take 1 tablet by mouth daily. 03/20/21  Yes [provider]  sertraline (ZOLOFT) 50 MG tablet PLEASE SEE ATTACHED FOR DETAILED DIRECTIONS  09/11/21  Yes [provider]  solifenacin (VESICARE) 5 MG tablet Take 1 tablet by mouth daily. 01/14/22  Yes [provider]  albuterol (VENTOLIN HFA) 108 (90 Base) MCG/ACT inhaler Inhale into the lungs. 10/16/20   [provider]  B Complex-C-Calcium (GNP B-COMPLEX PLUS VITAMIN C) TABS Take by mouth.    [provider]  beclomethasone (QVAR REDIHALER) 40 MCG/ACT inhaler Inhale into the lungs. 01/18/21   [provider]  fluticasone (FLONASE) 50 MCG/ACT nasal spray Place 1 spray into both nostrils daily. 05/20/20   [provider]  lidocaine (LIDODERM) 5 % SMARTSIG:Patch(s) Topical 03/06/21   [provider]  mupirocin nasal ointment (BACTROBAN) 2 % Apply to wound 3 times a day. 09/20/20   Becky Augusta, NP  nystatin ointment (MYCOSTATIN) Apply topically.    [provider]  Olopatadine HCl 0.2 % SOLN Apply 1 drop to eye daily. 05/12/20   [provider]  ondansetron (ZOFRAN ODT) 4 MG disintegrating tablet Take 1 tablet (4 mg total) by mouth every 8 (eight) hours as needed for nausea or vomiting. 05/09/21   Shirlee Latch, PA-C  senna (SENOKOT) 8.6 MG tablet Take 2 tablets by mouth at bedtime.    [provider]  Spacer/Aero-Holding Chambers (AEROCHAMBER PLUS) inhaler Use as instructed 11/14/19   Domenick Gong, MD  valACYclovir (VALTREX) 500 MG tablet  09/05/20   [provider]  amitriptyline (ELAVIL) 25 MG tablet Take 25 mg by mouth at bedtime as needed for sleep.  11/14/19  [provider]  lisinopril-hydrochlorothiazide (PRINZIDE,ZESTORETIC) 20-25 MG tablet Take 1 tablet by mouth daily.  02/02/21  [provider]  topiramate (TOPAMAX) 25 MG tablet Please start at 25mg  nightly for a week. You may increase to 25mg  twice a day a week later if you are tolerating this well. You can increase to three times per day a week later if you are still tolerating this well. 05/01/20 09/20/20  [provider]    Family History Family History  Problem Relation Age of Onset   Hypertension Mother    Hypertension Sister    Hypertension Brother     Social History Social History   Tobacco Use   Smoking status: Never   Smokeless tobacco: Never  Vaping Use   Vaping Use: Never used  Substance Use Topics   Alcohol use: No    Alcohol/week: 0.0 standard drinks   Drug use: No     Allergies   Levofloxacin, Oxybutynin, Codeine, Iodinated contrast media, Other, Penicillins, Sulfa antibiotics, and Tape   Review of Systems Review of Systems  Constitutional:  Positive for activity change. Negative for appetite change, chills, diaphoresis and fever.  Gastrointestinal:  Positive for abdominal pain. Negative for blood in stool, constipation, diarrhea, nausea and vomiting.  Genitourinary:  Positive for pelvic pain. Negative for dysuria, enuresis, urgency and vaginal discharge.  Musculoskeletal:  Negative for back pain and myalgias.  Skin:  Negative for rash.  Hematological:  Negative for adenopathy.    Physical Exam Triage Vital Signs ED Triage Vitals  Enc Vitals Group     BP 02/25/22 1303 (!) 160/103     Pulse Rate 02/25/22 1303 70     Resp 02/25/22 1303 18     Temp 02/25/22 1303 97.8 F (36.6 C)     Temp Source 02/25/22 1303 Oral     SpO2 02/25/22 1303 100 %     Weight 02/25/22 1253 195 lb (88.5 kg)     Height 02/25/22 1253 4\' 11"  (1.499 m)     Head Circumference --      Peak Flow --      Pain Score 02/25/22 1251 10     Pain Loc --      Pain Edu? --      Excl. in [place]? --    No data found.  Updated Vital Signs BP (!) 150/92 (BP Location: Left Arm)   Pulse 70   Temp 97.8 F (36.6 C) (Oral)   Resp 18   Ht 4\' 11"  (1.499 m)   Wt 195 lb (88.5 kg)   LMP 10/25/2016   SpO2 100%   BMI 39.39 kg/m   Visual Acuity Right Eye Distance:   Left Eye Distance:   Bilateral Distance:    Right Eye Near:   Left Eye Near:    Bilateral Near:     Physical  Exam Constitutional:      General: She is not in acute distress.    Appearance: She is obese. She is not toxic-appearing.     Comments: Moves very slow due to pain  HENT:     Right Ear: External ear normal.     Left Ear: External ear normal.  Eyes:     General: No scleral icterus.    Conjunctiva/sclera: Conjunctivae normal.  Pulmonary:     Effort: Pulmonary effort is normal.  Abdominal:     General: Abdomen is protuberant. Bowel sounds are decreased.     Palpations: Abdomen is soft.     Tenderness: There is abdominal tenderness in the left lower quadrant. There is guarding and rebound.     Comments: Has tenderness on L mid abdomen as well with guarding, and LLL with rebound  Genitourinary:    Comments: Bimanual exam- movement of vaginal cuff provoked her pain. Due to obesity I could not assess for adnexal masses.  Musculoskeletal:        General: Normal range of motion.     Cervical back: Neck supple.  Skin:    General: Skin is warm and dry.  Neurological:     Mental Status: She is alert and oriented to person, place, and time.     Gait: Gait normal.  Psychiatric:        Mood and Affect: Mood normal.        Behavior: Behavior normal.        Thought Content: Thought content normal.        Judgment: Judgment normal.     UC Treatments / Results  Labs (all labs ordered are listed, but only abnormal results are displayed) Labs Reviewed  URINALYSIS, ROUTINE W REFLEX MICROSCOPIC - Abnormal; Notable for the following components:      Result Value   Specific Gravity, Urine <1.005 (*)    All other components within normal limits  CBC WITH DIFFERENTIAL/PLATELET - Abnormal; Notable for the following components:   RBC 5.50 (*)    All other components within normal limits  COMPREHENSIVE METABOLIC PANEL - Abnormal; Notable for the following components:   Potassium 3.1 (*)    AST 12 (*)    All other components within normal limits    EKG   Radiology CT ABDOMEN PELVIS WO  CONTRAST  Result Date: 02/25/2022 CLINICAL DATA:  Left lower quadrant pain EXAM: CT ABDOMEN AND PELVIS WITHOUT CONTRAST TECHNIQUE: Multidetector CT imaging of the abdomen and pelvis was performed following the standard protocol without IV contrast. RADIATION DOSE REDUCTION: This exam was performed according to the departmental dose-optimization program which includes automated exposure control, adjustment of the mA and/or kV according to patient size and/or use of iterative reconstruction technique. COMPARISON:  CT abdomen and pelvis 04/12/2015 FINDINGS: Lower chest: No acute abnormality. Hepatobiliary: No focal liver abnormality is seen. No gallstones, gallbladder wall thickening, or biliary dilatation. Pancreas: Mildly atrophic with no mass or ductal dilatation identified. Spleen: Normal in size without focal abnormality. Adrenals/Urinary Tract: Adrenal glands appear normal. No nephrolithiasis or hydronephrosis identified. Small extrarenal pelvises are again seen. 6.9 x 6.7 cm cyst in the lower left kidney. Urinary bladder appears normal Stomach/Bowel: Stomach is within normal limits. Appendix appears normal. No evidence of bowel wall thickening, distention, or inflammatory changes. Vascular/Lymphatic: No significant vascular findings are present. No enlarged abdominal or pelvic lymph nodes. Reproductive: Status post hysterectomy. No adnexal masses. Other: No ascites. Musculoskeletal: No acute or significant osseous findings. IMPRESSION: 1. No acute process identified. 2. Left renal cyst. Electronically Signed   By: Jannifer Hick M.D.   On: 02/25/2022 14:49    Procedures Procedures (including critical care time)  Medications Ordered in UC Medications - No data to display  Initial Impression / Assessment and Plan / UC Course  I have reviewed the triage vital signs and the nursing notes.  Pertinent labs & imaging results that were available during my care of the patient were reviewed by me and  considered in my medical decision making (see chart for details).   LLQ and L mid abdominal pain L renal cyst  I placed her on Norco as noted which she may take if combination of Motrin and Tylenol do not help.   Needs to FU with a nephrologist.  Final Clinical Impressions(s) / UC Diagnoses   Final diagnoses:  Renal cyst  Acute left lower quadrant pain     Discharge Instructions      Your cat scan only shows a left kidney cyst, the rest is normal Your blood work is normal.  Your urine is negative for infection You need to follow up with a nephrologist next week.  If your pain, gets worse, go to ER.      ED Prescriptions     Medication Sig Dispense Auth. Provider   HYDROcodone-acetaminophen (NORCO/VICODIN) 5-325 MG tablet Take 1 tablet by mouth every 6 (six) hours as needed. 12 tablet Rodriguez-Southworth, Nettie Elm, PA-C      I have reviewed the PDMP during this encounter.   Garey Ham, PA-C 02/25/22 1512

## 2022-02-25 NOTE — ED Triage Notes (Signed)
Patient is here for "Abd Pain" (Lower left, to middle, inguinal area). History of "UTI, but this doesn't feel like it". No dysuria. Stools "normal". No nausea. No vomiting.  ?

## 2022-05-09 ENCOUNTER — Ambulatory Visit
Admission: EM | Admit: 2022-05-09 | Discharge: 2022-05-09 | Disposition: A | Discharge status: Home / Self Care | Payer: BC Managed Care – PPO | Attending: Physician Assistant | Admitting: Physician Assistant

## 2022-05-09 ENCOUNTER — Encounter: Payer: Self-pay | Admitting: Emergency Medicine

## 2022-05-09 DIAGNOSIS — B029 Zoster without complications: Secondary | ICD-10-CM

## 2022-05-09 MED ORDER — HYDROCODONE-ACETAMINOPHEN 5-325 MG PO TABS
2.0000 | ORAL_TABLET | Freq: Three times a day (TID) | ORAL | 0 refills | Status: AC | PRN
Start: 2022-05-09 — End: 2022-05-14

## 2022-05-09 MED ORDER — VALACYCLOVIR HCL 1 G PO TABS: 1000.0000 mg | ORAL_TABLET | Freq: Three times a day (TID) | ORAL | 0 refills | Status: AC

## 2022-05-09 NOTE — Discharge Instructions (Addendum)
Rash is consistent with shingles. - I have sent an antiviral medicine to the pharmacy to hopefully help keep things from getting worse.  I have also sent pain medicine to use if needed.  Can use cool compresses.  Try not to apply anything topically to the rash. - I have given you a work note for the next couple of days. - You are contagious to anybody who has not had chickenpox or varicella vaccine such as babies, young children.  You should also avoid pregnant women and anyone who is immunocompromised. - This generally heals in 7 to 10 days.  Should be healed when you notice that the rash is crusted over.  This is when you will no longer be contagious.

## 2022-05-09 NOTE — ED Provider Notes (Signed)
[facility]    CSN: [id number] Arrival date & time: 05/09/22  1215      History   Chief Complaint Chief Complaint  Patient presents with   Rash    Right shoulder    HPI Tiffany Andrade is a 46 y.o. female presenting for painful, pruritic rash of the right shoulder x4 days.  Patient reports chest pain of the right side of her chest as well and the back of her right shoulder.  She says she thought it was just a bug bite at first but then she spoke with her sister who has had shingles multiple times and she told her that was probably shingles.  Patient has been applying hydrocortisone topically without relief.  She denies contact with any known allergens.  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   Right leg pain 04/14/2018   Migraine without aura and without status migrainosus, not intractable 06/03/2017   Chronic cystitis 04/06/2017   Hematuria, microscopic 04/06/2017   Incomplete emptying of bladder 04/06/2017   Ureteral mass 04/06/2017   Unilateral ureteral reflux 04/06/2017   Right arm pain 11/12/2013   Anemia 09/25/2013   Constipation 09/25/2013   Dysmenorrhea 09/25/2013   Menorrhagia 09/25/2013   Hematochezia 05/08/2013   Obstructive sleep apnea 12/26/2012   Essential (primary) hypertension 10/19/2012   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      Home Medications    Prior to Admission medications   Medication Sig Start Date End Date Taking? Authorizing Provider  HYDROcodone-acetaminophen (NORCO/VICODIN) 5-325 MG tablet Take 2 tablets by mouth every 8 (eight) hours as needed for up to 5 days. 05/09/22 05/14/22 Yes Danton Clap, PA-C  valACYclovir (VALTREX) 1000 MG tablet Take 1 tablet (1,000 mg total) by mouth 3 (three) times daily. 05/09/22  Yes Laurene Footman B, PA-C  albuterol (VENTOLIN HFA) 108 (90 Base) MCG/ACT inhaler Inhale into the lungs. 10/16/20   [provider]  amLODipine (NORVASC) 5 MG tablet Take by mouth. 09/26/18   [provider]  B Complex-C-Calcium (GNP B-COMPLEX PLUS VITAMIN C) TABS Take by mouth.    [provider]  beclomethasone (QVAR REDIHALER) 40 MCG/ACT inhaler Inhale into the lungs. 01/18/21   [provider]  busPIRone (BUSPAR) 10 MG tablet Take 5 mg by mouth 2 (two) times daily. 11/09/19   [provider]  candesartan (ATACAND) 32 MG tablet Take by mouth. 12/26/20   [provider]  cetirizine (ZYRTEC) 10 MG tablet Take 10 mg by mouth daily. 03/19/21   [provider]  cyclobenzaprine (FLEXERIL) 10 MG  tablet Take 10 mg by mouth at bedtime as needed. 12/23/19   [provider]  fluticasone (FLONASE) 50 MCG/ACT nasal spray Place 1 spray into both nostrils daily. 05/20/20   [provider]  Galcanezumab-gnlm 120 MG/ML SOAJ Inject into the skin. 12/29/21   [provider]  hydrOXYzine (VISTARIL) 25 MG capsule Take 25-50 mg by mouth 2 (two) times daily as needed. 01/23/21   [provider]  lidocaine (LIDODERM) 5 % SMARTSIG:Patch(s) Topical 03/06/21   [provider]  MAGNESIUM PO Take by mouth.    [provider]  meloxicam (MOBIC) 15 MG tablet Take by mouth. 09/14/19   [provider]  methocarbamol (ROBAXIN) 500 MG tablet Take 500 mg by mouth 2 (two) times daily as needed. 01/31/22   [provider]  mupirocin nasal ointment (BACTROBAN) 2 % Apply to wound 3 times a day. 09/20/20   Margarette Canada, NP  nystatin ointment (MYCOSTATIN) Apply topically.    [provider]  Olopatadine HCl 0.2 % SOLN Apply 1 drop to eye daily. 05/12/20   [provider]  ondansetron (ZOFRAN ODT) 4 MG disintegrating tablet Take 1 tablet (4 mg total) by mouth every 8 (eight) hours as needed for nausea or vomiting. 05/09/21   Laurene Footman B, PA-C  OZEMPIC, 0.25 OR 0.5 MG/DOSE, 2 MG/1.5ML SOPN Inject into the skin. 05/20/21   [provider]  polyethylene glycol powder (GLYCOLAX/MIRALAX) 17 GM/SCOOP powder Take by mouth.    [provider]  POTASSIUM PO Take by mouth.    [provider]  rizatriptan (MAXALT-MLT) 10 MG disintegrating tablet Give one tablet may repeat in 2 hours if needed.  No more than 4 in 1 week 04/14/18   [provider]  senna (SENOKOT) 8.6 MG tablet Take 2 tablets by mouth at bedtime.    [provider]  sertraline (ZOLOFT) 100 MG tablet Take 1 tablet by mouth daily. 03/20/21   [provider]  sertraline (ZOLOFT) 50 MG tablet PLEASE SEE ATTACHED FOR DETAILED DIRECTIONS 09/11/21   [provider]  solifenacin (VESICARE) 5 MG tablet Take 1 tablet by mouth daily. 01/14/22   [provider]  Spacer/Aero-Holding Chambers (AEROCHAMBER PLUS) inhaler Use as instructed 11/14/19   Melynda Ripple, MD  amitriptyline (ELAVIL) 25 MG tablet Take 25 mg by mouth at bedtime as needed for sleep.  11/14/19  [provider]  lisinopril-hydrochlorothiazide (PRINZIDE,ZESTORETIC) 20-25 MG tablet Take 1 tablet by mouth daily.  02/02/21  [provider]  topiramate (TOPAMAX) 25 MG tablet Please start at '25mg'$   nightly for a week. You may increase to '25mg'$  twice a day a week later if you are tolerating this well. You can increase to three times per day a week later if you are still tolerating this well. 05/01/20 09/20/20  [provider]    Family History Family History  Problem Relation Age of Onset   Hypertension Mother    Hypertension Sister    Hypertension Brother     Social History Social History   Tobacco Use   Smoking status: Never   Smokeless tobacco: Never  Vaping Use   Vaping Use: Never used  Substance Use Topics   Alcohol use: No    Alcohol/week: 0.0 standard drinks of alcohol   Drug use: No     Allergies   Levofloxacin, Oxybutynin, Codeine, Iodinated contrast media, Other, Penicillins, Sulfa antibiotics, and Tape   Review of Systems Review of Systems  Constitutional:  Negative for fatigue and  fever.  Cardiovascular:  Positive for chest pain (right).  Musculoskeletal:  Positive for arthralgias. Negative for joint swelling.  Skin:  Positive for rash.  Neurological:  Negative for weakness.     Physical Exam Triage Vital Signs ED Triage Vitals  Enc Vitals Group     BP      Pulse      Resp      Temp      Temp src      SpO2      Weight      Height      Head Circumference      Peak Flow      Pain Score      Pain Loc      Pain Edu?      Excl. in [place]?    No data found.  Updated Vital Signs BP (!) 143/103 (BP Location: Left Arm)   Pulse 85   Temp 98.1 F (36.7 C) (Oral)   Resp 14   Ht '4\' 11"'$  (1.499 m)   Wt 197 lb (89.4 kg)   LMP 10/25/2016   SpO2 99%   BMI 39.79 kg/m      Physical Exam Vitals and nursing note reviewed.  Constitutional:      General: She is not in acute distress.    Appearance: Normal appearance. She is not ill-appearing or toxic-appearing.  HENT:     Head: Normocephalic and atraumatic.  Eyes:     General: No scleral icterus.       Right eye: No discharge.        Left eye: No discharge.     Conjunctiva/sclera:  Conjunctivae normal.  Cardiovascular:     Rate and Rhythm: Normal rate and regular rhythm.     Heart sounds: Normal heart sounds.  Pulmonary:     Effort: Pulmonary effort is normal. No respiratory distress.     Breath sounds: Normal breath sounds.  Musculoskeletal:     Cervical back: Neck supple.  Skin:    General: Skin is dry.     Findings: Rash (multiple patches of erythema with vesicles of right shoulder and small area on right chest) present.  Neurological:     General: No focal deficit present.     Mental Status: She is alert. Mental status is at baseline.     Motor: No weakness.     Gait: Gait normal.  Psychiatric:        Mood and Affect: Mood normal.        Behavior: Behavior normal.        Thought Content: Thought content normal.      UC Treatments / Results  Labs (all labs ordered are listed, but only abnormal results are displayed) Labs Reviewed - No data to display  EKG   Radiology No results found.  Procedures Procedures (including critical care time)  Medications Ordered in UC Medications - No data to display  Initial Impression / Assessment and Plan / UC Course  I have reviewed the triage vital signs and the nursing notes.  Pertinent labs & imaging results that were available during my care of the patient were reviewed by me and considered in my medical decision making (see chart for details).  46 year old female presenting for painful pruritic rash of right shoulder x4 days.  On examination, rash is most closely consistent with shingles.  We will treat patient at this time with Valtrex.  Also sent hydrocodone after reviewing controlled substance database and find her to  be low risk for abuse.  Discussed supportive care.  Discussed to she could be contagious to.  Reviewed return and ER precautions.   Final Clinical Impressions(s) / UC Diagnoses   Final diagnoses:  Herpes zoster without complication     Discharge Instructions      Rash is  consistent with shingles. - I have sent an antiviral medicine to the pharmacy to hopefully help keep things from getting worse.  I have also sent pain medicine to use if needed.  Can use cool compresses.  Try not to apply anything topically to the rash. - I have given you a work note for the next couple of days. - You are contagious to anybody who has not had chickenpox or varicella vaccine such as babies, young children.  You should also avoid pregnant women and anyone who is immunocompromised. - This generally heals in 7 to 10 days.  Should be healed when you notice that the rash is crusted over.  This is when you will no longer be contagious.     ED Prescriptions     Medication Sig Dispense Auth. Provider   valACYclovir (VALTREX) 1000 MG tablet Take 1 tablet (1,000 mg total) by mouth 3 (three) times daily. 21 tablet Laurene Footman B, PA-C   HYDROcodone-acetaminophen (NORCO/VICODIN) 5-325 MG tablet Take 2 tablets by mouth every 8 (eight) hours as needed for up to 5 days. 15 tablet Danton Clap, PA-C      I have reviewed the PDMP during this encounter.   Danton Clap, PA-C 05/09/22 1256

## 2022-05-09 NOTE — ED Triage Notes (Signed)
Patient c/o itchy burning red rash on her right shoulder that started on Wed.

## 2022-08-18 ENCOUNTER — Ambulatory Visit (INDEPENDENT_AMBULATORY_CARE_PROVIDER_SITE_OTHER)
Admission: EM | Admit: 2022-08-18 | Discharge: 2022-08-18 | Disposition: A | Discharge status: Other Acute Inpatient Hospital | Payer: BC Managed Care – PPO | Source: Home / Self Care | Attending: Family Medicine | Admitting: Family Medicine

## 2022-08-18 ENCOUNTER — Encounter: Payer: Self-pay | Admitting: Emergency Medicine

## 2022-08-18 ENCOUNTER — Emergency Department
Admission: EM | Admit: 2022-08-18 | Discharge: 2022-08-19 | Disposition: A | Discharge status: Home / Self Care | Payer: BC Managed Care – PPO | Attending: Emergency Medicine | Admitting: Emergency Medicine

## 2022-08-18 ENCOUNTER — Other Ambulatory Visit: Payer: Self-pay

## 2022-08-18 ENCOUNTER — Emergency Department: Payer: BC Managed Care – PPO

## 2022-08-18 DIAGNOSIS — D72829 Elevated white blood cell count, unspecified: Secondary | ICD-10-CM | POA: Diagnosis not present

## 2022-08-18 DIAGNOSIS — I10 Essential (primary) hypertension: Secondary | ICD-10-CM | POA: Diagnosis not present

## 2022-08-18 DIAGNOSIS — E871 Hypo-osmolality and hyponatremia: Secondary | ICD-10-CM | POA: Diagnosis not present

## 2022-08-18 DIAGNOSIS — H538 Other visual disturbances: Secondary | ICD-10-CM | POA: Insufficient documentation

## 2022-08-18 DIAGNOSIS — R42 Dizziness and giddiness: Secondary | ICD-10-CM

## 2022-08-18 LAB — CBC WITH DIFFERENTIAL/PLATELET
Abs Immature Granulocytes: 0.07 10*3/uL (ref 0.00–0.07)
Basophils Absolute: 0.1 10*3/uL (ref 0.0–0.1)
Basophils Relative: 1 %
Eosinophils Absolute: 0.2 10*3/uL (ref 0.0–0.5)
Eosinophils Relative: 2 %
HCT: 45 % (ref 36.0–46.0)
Hemoglobin: 15 g/dL (ref 12.0–15.0)
Immature Granulocytes: 1 %
Lymphocytes Relative: 25 %
Lymphs Abs: 3.2 10*3/uL (ref 0.7–4.0)
MCH: 28.7 pg (ref 26.0–34.0)
MCHC: 33.3 g/dL (ref 30.0–36.0)
MCV: 86.2 fL (ref 80.0–100.0)
Monocytes Absolute: 0.9 10*3/uL (ref 0.1–1.0)
Monocytes Relative: 7 %
Neutro Abs: 8.4 10*3/uL — ABNORMAL HIGH (ref 1.7–7.7)
Neutrophils Relative %: 64 %
Platelets: 286 10*3/uL (ref 150–400)
RBC: 5.22 MIL/uL — ABNORMAL HIGH (ref 3.87–5.11)
RDW: 14.1 % (ref 11.5–15.5)
WBC: 12.8 10*3/uL — ABNORMAL HIGH (ref 4.0–10.5)
nRBC: 0 % (ref 0.0–0.2)

## 2022-08-18 LAB — CBC
HCT: 44 % (ref 36.0–46.0)
Hemoglobin: 14.4 g/dL (ref 12.0–15.0)
MCH: 27.7 pg (ref 26.0–34.0)
MCHC: 32.7 g/dL (ref 30.0–36.0)
MCV: 84.6 fL (ref 80.0–100.0)
Platelets: 293 10*3/uL (ref 150–400)
RBC: 5.2 MIL/uL — ABNORMAL HIGH (ref 3.87–5.11)
RDW: 13.7 % (ref 11.5–15.5)
WBC: 12.3 10*3/uL — ABNORMAL HIGH (ref 4.0–10.5)
nRBC: 0 % (ref 0.0–0.2)

## 2022-08-18 LAB — BASIC METABOLIC PANEL
Anion gap: 7 (ref 5–15)
Anion gap: 8 (ref 5–15)
BUN: 22 mg/dL — ABNORMAL HIGH (ref 6–20)
BUN: 21 mg/dL — ABNORMAL HIGH (ref 6–20)
CO2: 25 mmol/L (ref 22–32)
CO2: 27 mmol/L (ref 22–32)
Calcium: 8.8 mg/dL — ABNORMAL LOW (ref 8.9–10.3)
Calcium: 9.2 mg/dL (ref 8.9–10.3)
Chloride: 98 mmol/L (ref 98–111)
Chloride: 100 mmol/L (ref 98–111)
Creatinine, Ser: 0.74 mg/dL (ref 0.44–1.00)
Creatinine, Ser: 0.69 mg/dL (ref 0.44–1.00)
GFR, Estimated: >60 mL/min (ref >60)
GFR, Estimated: >60 mL/min (ref >60)
Glucose, Bld: 96 mg/dL (ref 70–99)
Glucose, Bld: 114 mg/dL — ABNORMAL HIGH (ref 70–99)
Potassium: 3.4 mmol/L — ABNORMAL LOW (ref 3.5–5.1)
Potassium: 3.2 mmol/L — ABNORMAL LOW (ref 3.5–5.1)
Sodium: 130 mmol/L — ABNORMAL LOW (ref 135–145)
Sodium: 135 mmol/L (ref 135–145)

## 2022-08-18 LAB — URINALYSIS, ROUTINE W REFLEX MICROSCOPIC
Bilirubin Urine: NEGATIVE
Glucose, UA: NEGATIVE mg/dL
Hgb urine dipstick: NEGATIVE
Ketones, ur: NEGATIVE mg/dL
Leukocytes,Ua: NEGATIVE
Nitrite: NEGATIVE
Protein, ur: NEGATIVE mg/dL
Specific Gravity, Urine: 1.008 (ref 1.005–1.030)
pH: 6 (ref 5.0–8.0)

## 2022-08-18 LAB — TROPONIN I (HIGH SENSITIVITY)
Troponin I (High Sensitivity): 4 ng/L (ref <18)
Troponin I (High Sensitivity): 3 ng/L (ref <18)

## 2022-08-18 MED ORDER — ONDANSETRON 4 MG PO TBDP: 4.0000 mg | ORAL_TABLET | Freq: Three times a day (TID) | ORAL | 0 refills | Status: DC | PRN

## 2022-08-18 MED ORDER — METOCLOPRAMIDE HCL 10 MG PO TABS
10.0000 mg | ORAL_TABLET | Freq: Once | ORAL | Status: AC
  Administered 2022-08-18: 10 mg via ORAL
  Filled 2022-08-18: qty 1

## 2022-08-18 MED ORDER — MECLIZINE HCL 25 MG PO TABS: 25.0000 mg | ORAL_TABLET | Freq: Three times a day (TID) | ORAL | 0 refills | Status: DC | PRN

## 2022-08-18 NOTE — ED Provider Triage Note (Signed)
Emergency Medicine Provider Triage Evaluation Note  Tiffany Andrade , a 46 y.o. female  was evaluated in triage.  Pt complains of dizziness for over a week..  Review of Systems  Positive:  Negative:   Physical Exam  BP 137/86   Pulse 80   Temp 98.5 F (36.9 C) (Oral)   Resp 15   Wt 84.8 kg   LMP 10/25/2016   SpO2 100%   BMI 37.77 kg/m  Gen:   Awake, no distress   Resp:  Normal effort  MSK:   Moves extremities without difficulty  Other:    Medical Decision Making  Medically screening exam initiated at 7:01 PM.  Appropriate orders placed.  Tiffany Andrade was informed that the remainder of the evaluation will be completed by another provider, this initial triage assessment does not replace that evaluation, and the importance of remaining in the ED until their evaluation is complete.     Versie Starks, PA-C 08/18/22 1901

## 2022-08-18 NOTE — ED Provider Notes (Signed)
[facility] Emergency Department Provider Note     Event Date/Time   First MD Initiated Contact with Patient 08/18/22 2135     (approximate)   History   Dizziness   HPI  Tiffany Andrade is a 46 y.o. female history of neurofibromatosis, hypertension, left homonymous hemianopsia, migraine, hypokalemia, and optic nerve glioma, presents to the ED for 1 week of intermittent episodes of low blood pressure readings, intermittent dizziness.  She notes that she recently had her spironolactone dose increased to 50 mg daily.  She also would report blood pressure readings in the 110s over 70s.  She has at times felt dizzy with positional changes including turning her head and changing levels.  She denies any outright syncope, tinnitus, or hearing loss.  She does endorse some blurry vision, noting she is having a difficult time focusing at times.  Patient was initially evaluated at a local urgent care, sent here to the ED for further evaluation.     Physical Exam   Triage Vital Signs: ED Triage Vitals [08/18/22 1852]  Enc Vitals Group     BP 137/86     Pulse Rate 80     Resp 15     Temp 98.5 F (36.9 C)     Temp Source Oral     SpO2 100 %     Weight 187 lb (84.8 kg)     Height      Head Circumference      Peak Flow      Pain Score 0     Pain Loc      Pain Edu?      Excl. in [place]?     Most recent vital signs: Vitals:   08/18/22 2056 08/18/22 2348  BP: (!) 143/87 112/74  Pulse: 77 75  Resp: 20 16  Temp: 98.2 F (36.8 C) (!) 97.5 F (36.4 C)  SpO2: 100% 96%    General Awake, no distress. NAD HEENT NCAT. PERRL. EOMI. normal funduscopic exam bilaterally.  No rhinorrhea. Mucous membranes are moist.  CV:  Good peripheral perfusion.  RESP:  Normal effort.  ABD:  No distention.  NEURO:   ED Results / Procedures / Treatments   Labs (all labs ordered are listed, but only abnormal results are displayed) Labs Reviewed  BASIC METABOLIC PANEL -  Abnormal; Notable for the following components:      Result Value   Potassium 3.2 (*)    Glucose, Bld 114 (*)    BUN 21 (*)    All other components within normal limits  CBC - Abnormal; Notable for the following components:   WBC 12.3 (*)    RBC 5.20 (*)    All other components within normal limits  URINALYSIS, ROUTINE W REFLEX MICROSCOPIC - Abnormal; Notable for the following components:   Color, Urine STRAW (*)    APPearance CLEAR (*)    All other components within normal limits  TROPONIN I (HIGH SENSITIVITY)  TROPONIN I (HIGH SENSITIVITY)     EKG  Vent. rate 79 BPM PR interval 190 ms QRS duration 78 ms QT/QTcB 396/454 ms P-R-T axes 48 62 38  RADIOLOGY  I personally viewed and evaluated these images as part of my medical decision making, as well as reviewing the written report by the radiologist.  ED Provider Interpretation: no acute findings  CT HEAD WO CONTRAST  Result Date: 08/18/2022 CLINICAL DATA:  Dizziness and blurred vision EXAM: CT HEAD WITHOUT CONTRAST TECHNIQUE: Contiguous axial images  were obtained from the base of the skull through the vertex without intravenous contrast. RADIATION DOSE REDUCTION: This exam was performed according to the departmental dose-optimization program which includes automated exposure control, adjustment of the mA and/or kV according to patient size and/or use of iterative reconstruction technique. COMPARISON:  CT head 04/02/2017 FINDINGS: Brain: No intracranial hemorrhage, mass effect, or evidence of acute infarct. No hydrocephalus. No extra-axial fluid collection. Vascular: No hyperdense vessel or unexpected calcification. Skull: No fracture or focal lesion. Sinuses/Orbits: No acute finding. Paranasal sinuses and mastoid air cells are well aerated. Other: None. IMPRESSION: No acute intracranial abnormality. Electronically Signed   By: Placido Sou M.D.   On: 08/18/2022 19:42     PROCEDURES:  Critical Care performed:  No  Procedures   MEDICATIONS ORDERED IN ED: Medications  metoCLOPramide (REGLAN) tablet 10 mg (10 mg Oral Given 08/18/22 2347)     IMPRESSION / MDM / ASSESSMENT AND PLAN / ED COURSE  I reviewed the triage vital signs and the nursing notes.                              Differential diagnosis includes, but is not limited to, BPV, intracranial hemorrhage, meningitis/encephalitis, previous head trauma, cavernous venous thrombosis, tension headache, temporal arteritis, migraine or migraine equivalent, idiopathic intracranial hypertension, and non-specific headache.   Patient's presentation is most consistent with acute presentation with potential threat to life or bodily function.  Patient to the ED for evaluation of about a 1 week complaint of intermittent episodes of vertigo and dizziness.  He denies any syncope or head injury.  She would not endorse symptoms when changing positions or changing levels.  She also would note some intermittent fleeting blurry vision.  Patient is also been aware of her blood pressure is reading lower than her typical standard which is 510C systolic.  She has had readings in the 110s over this last week.  She is aware of her recent blood pressure medicine increased about 3 weeks ago, and we discussed that this may be her response to that medication at this time.  His exam is otherwise reassuring and benign without any acute neuromuscular deficits.  No signs of cerebellar ataxia on exam.  Her labs are reassuring at this time with again demonstrated stable mild hypokalemia.  Troponin is normal x1, and no signs of an acute UTI, critical anemia, or electrolyte abnormality.  Patient's head CT is also negative for any acute intracranial process, based on my review and interpretation of images.  Patient's diagnosis is consistent with likely benign positional vertigo versus symptomatic hypotension, with readings lower than her typical systolic.  Occasional orthostatic  hypotension on her exam.  We did discuss further evaluation of her symptoms with an MRI of the brain here in the ED.  Patient at this time, declining the MRI, or preferring instead to follow-up with one of her 2 neurologist for further evaluation and outpatient imaging as necessary.  Patient will be discharged home with prescriptions for meclizine and Zofran. Patient is to follow up with primary provider as discussed as needed or otherwise directed. Patient is given ED precautions to return to the ED for any worsening or new symptoms.   FINAL CLINICAL IMPRESSION(S) / ED DIAGNOSES   Final diagnoses:  Dizziness  Lightheadedness     Rx / DC Orders   ED Discharge Orders          Ordered    ondansetron (ZOFRAN-ODT)  4 MG disintegrating tablet  Every 8 hours PRN        08/18/22 2357    meclizine (MEDI-MECLIZINE) 25 MG tablet  3 times daily PRN        08/18/22 2357             Note:  This document was prepared using Dragon voice recognition software and may include unintentional dictation errors.    Melvenia Needles, PA-C 08/18/22 2357    Nathaniel Man, MD 08/20/22 [phone]

## 2022-08-18 NOTE — ED Provider Notes (Signed)
[facility]    CSN: [id number] Arrival date & time: 08/18/22  1445      History   Chief Complaint Chief Complaint  Patient presents with   Hypotension   Dizziness   Blurred Vision    HPI Tiffany Andrade is a 46 y.o. female.   HPI  Tiffany Andrade presents for concerns over lower blood pressures.  BP at home as low as 110/62. Says her blood pressures 130s/80s.  No loss of consciousness or falls. She has stumbled. She stops in place when her vision goes blurry until she can see again. Has history of migraines typically on the right. She has been feeling pressure behind her eyes and intermittent blurry vision and lightheadedness.  Blurry vision last a couple minutes and then goes away.   Has "bad sciatica". Has sharp pains from her knees to her foot. Today, having tingling and achy. States these symptoms are not unusual for her.  Noticed some swelling in her legs.   Denies fever, chills, sore throat.  Has headaches, cough and nasal congestion.  No new back or neck pain.  No focal weakness.  She follows with neurology. Has Neurofibromatosis Type 1.  States she has had head imaging twice this year.  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   Right leg pain 04/14/2018   Migraine without aura and without status migrainosus, not intractable 06/03/2017   Chronic cystitis 04/06/2017   Hematuria, microscopic 04/06/2017   Incomplete emptying of bladder  04/06/2017   Ureteral mass 04/06/2017   Unilateral ureteral reflux 04/06/2017   Right arm pain 11/12/2013   Anemia 09/25/2013   Constipation 09/25/2013   Dysmenorrhea 09/25/2013   Menorrhagia 09/25/2013   Hematochezia 05/08/2013   Obstructive sleep apnea 12/26/2012   Essential (primary) hypertension 10/19/2012   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      Home Medications    Prior to Admission medications   Medication Sig Start Date End Date Taking? Authorizing Provider  albuterol (VENTOLIN HFA) 108 (90 Base) MCG/ACT inhaler Inhale into the lungs. 10/16/20  Yes [provider]  amLODipine (NORVASC) 5 MG tablet Take by mouth. 09/26/18  Yes [provider]  B Complex-C-Calcium (GNP B-COMPLEX PLUS VITAMIN C) TABS Take by mouth.   Yes [provider]  beclomethasone (QVAR REDIHALER) 40 MCG/ACT inhaler Inhale into the lungs. 01/18/21  Yes [provider]  busPIRone (BUSPAR) 10 MG tablet Take 5 mg by mouth 2 (two) times daily. 11/09/19  Yes [provider]  candesartan (ATACAND) 32 MG tablet Take by mouth. 12/26/20  Yes [provider]  cyclobenzaprine (FLEXERIL) 10 MG tablet Take 10 mg by mouth at bedtime as needed. 12/23/19  Yes [provider]  fluticasone (FLONASE) 50 MCG/ACT nasal spray Place 1 spray into both nostrils daily. 05/20/20  Yes [provider]  Galcanezumab-gnlm 120 MG/ML SOAJ Inject into the skin. 12/29/21  Yes [provider]  hydrOXYzine (VISTARIL) 25 MG capsule Take 25-50 mg by mouth 2 (two) times daily as needed. 01/23/21  Yes [provider]  Olopatadine HCl 0.2 % SOLN Apply 1 drop to eye daily. 05/12/20  Yes  [provider]  ondansetron (ZOFRAN ODT) 4 MG disintegrating tablet Take 1 tablet (4 mg total) by mouth every 8 (eight) hours as needed for nausea or vomiting. 05/09/21  Yes Laurene Footman B, PA-C  rizatriptan (MAXALT-MLT) 10 MG disintegrating tablet Give one tablet may repeat in 2 hours if needed.  No more than 4 in 1 week 04/14/18  Yes [provider]  sertraline (ZOLOFT) 100 MG tablet Take 1 tablet by mouth daily. 03/20/21  Yes [provider]  cetirizine (ZYRTEC) 10 MG tablet Take 10 mg by mouth daily. 03/19/21   [provider]  lidocaine (LIDODERM) 5 % SMARTSIG:Patch(s) Topical 03/06/21   [provider]  MAGNESIUM PO Take by mouth.    [provider]  meloxicam (MOBIC) 15 MG tablet Take by mouth. 09/14/19   [provider]  methocarbamol (ROBAXIN) 500 MG tablet Take 500 mg by mouth 2 (two) times daily as needed. 01/31/22   [provider]  mupirocin nasal ointment (BACTROBAN) 2 % Apply to wound 3 times a day. 09/20/20   Margarette Canada, NP  nystatin ointment (MYCOSTATIN) Apply topically.    [provider]  OZEMPIC, 0.25 OR 0.5 MG/DOSE, 2 MG/1.5ML SOPN Inject into the skin. 05/20/21   [provider]  polyethylene glycol powder (GLYCOLAX/MIRALAX) 17 GM/SCOOP powder Take by mouth.    [provider]  POTASSIUM PO Take by mouth.    [provider]  senna (SENOKOT) 8.6 MG tablet Take 2 tablets by mouth at bedtime.    [provider]  sertraline (ZOLOFT) 50 MG tablet PLEASE SEE ATTACHED FOR DETAILED DIRECTIONS 09/11/21   [provider]  solifenacin (VESICARE) 5 MG tablet Take 1 tablet by mouth daily. 01/14/22   [provider]  Spacer/Aero-Holding Chambers (AEROCHAMBER PLUS) inhaler Use as instructed 11/14/19   Melynda Ripple, MD  valACYclovir (VALTREX) 1000 MG tablet Take 1 tablet (1,000 mg total) by mouth 3 (three) times daily. 05/09/22   Danton Clap, PA-C   amitriptyline (ELAVIL) 25 MG tablet Take 25 mg by mouth at bedtime as needed for sleep.  11/14/19  [provider]  lisinopril-hydrochlorothiazide (PRINZIDE,ZESTORETIC) 20-25 MG tablet Take 1 tablet by mouth daily.  02/02/21  [provider]  topiramate (TOPAMAX) 25 MG tablet Please start at '25mg'$  nightly for a week. You may increase to '25mg'$  twice a day a week later if you are tolerating this well. You can increase to three times per day a week later if you are still tolerating this well. 05/01/20 09/20/20  [provider]    Family History Family History  Problem Relation Age of Onset   Hypertension Mother    Hypertension Sister    Hypertension Brother     Social History Social History   Tobacco Use   Smoking status: Never   Smokeless tobacco: Never  Vaping Use   Vaping Use: Never used  Substance Use Topics   Alcohol use: No  Alcohol/week: 0.0 standard drinks of alcohol   Drug use: No     Allergies   Levofloxacin, Oxybutynin, Codeine, Iodinated contrast media, Other, Penicillins, Sulfa antibiotics, and Tape   Review of Systems Review of Systems : negative unless otherwise stated in HPI.      Physical Exam Triage Vital Signs ED Triage Vitals  Enc Vitals Group     BP 08/18/22 1538 108/71     Pulse Rate 08/18/22 1538 86     Resp 08/18/22 1538 16     Temp 08/18/22 1538 98 F (36.7 C)     Temp Source 08/18/22 1538 Oral     SpO2 08/18/22 1538 99 %     Weight --      Height --      Head Circumference --      Peak Flow --      Pain Score 08/18/22 1535 9     Pain Loc --      Pain Edu? --      Excl. in [place]? --    No data found.  Updated Vital Signs BP 108/71 (BP Location: Left Arm)   Pulse 86   Temp 98 F (36.7 C) (Oral)   Resp 16   LMP 10/25/2016   SpO2 99%   Visual Acuity Right Eye Distance:   Left Eye Distance:   Bilateral Distance:    Right Eye Near:   Left Eye Near:    Bilateral Near:     Physical Exam  GEN: alert,  nontoxic appearing female, in no acute distress     EYES:   pupils equal and reactive to light, EOM intact however inconsistently follows past midline smoothly NECK:  supple, normal ROM, no midline C-spine tenderness RESP:  clear to auscultation bilaterally, no increased work of breathing  CVS:   regular rate and rhythm EXT:   normal ROM, no appreciable lower extremity pitting edema  NEURO: Oriented at baseline, alert, speech normal, normal gait, strength 5/5 b/l UE and LE Skin:   warm and dry, NF Psych: Normal affect, appropriate speech and behavior    UC Treatments / Results  Labs (all labs ordered are listed, but only abnormal results are displayed) Labs Reviewed  CBC WITH DIFFERENTIAL/PLATELET - Abnormal; Notable for the following components:      Result Value   WBC 12.8 (*)    RBC 5.22 (*)    Neutro Abs 8.4 (*)    All other components within normal limits  BASIC METABOLIC PANEL - Abnormal; Notable for the following components:   Sodium 130 (*)    Potassium 3.4 (*)    BUN 22 (*)    Calcium 8.8 (*)    All other components within normal limits    EKG   Radiology No results found.  Procedures Procedures (including critical care time)  Medications Ordered in UC Medications - No data to display  Initial Impression / Assessment and Plan / UC Course  I have reviewed the triage vital signs and the nursing notes.  Pertinent labs & imaging results that were available during my care of the patient were reviewed by me and considered in my medical decision making (see chart for details).     Patient is a 46 year old female who presents for intermittent blurry vision and dizziness for the past week.  She has history of neurofibromatosis type I, hypertension, migraines and chronic pain.  Reviewed most recent office note from [facility] urology.  I am unable to see the recent  head imaging.  Unfortunately, we do not have CT available tonight.  CBC with leukocytosis, WBC 12.  8  with left shift.  On BMP she has mild hyponatremia, sodium 130.  This is new for her.  Given these findings, I feel she needs a ED evaluation.  Discussed findings with patient and recommended she be evaluated in the ED.  Spoke with triage nurse at [facility]  Patient will travel by private vehicle to the hospital for further evaluation.  Discussed MDM, treatment plan and plan for follow-up with patient/parent who agrees with plan.      Final Clinical Impressions(s) / UC Diagnoses   Final diagnoses:  Dizziness  Hyponatremia  Leukocytosis, unspecified type     Discharge Instructions      You have an low sodium and an elevated white blood cell count. Considering you have dizziness, I am unsure if there is an acute issue with your brain or eyes. You have been advised to follow up immediately in the emergency department for concerning signs or symptoms as discussed during your visit. If you declined EMS transport, please have a family member take you directly to the ED at this time. Do not delay.   Based on concerns about condition, if you do not follow up in the ED, you may risk poor outcomes including worsening of condition, delayed treatment and potentially life threatening issues. If you have declined to go to the ED at this time, you should call your PCP immediately to set up a follow up appointment.   Go to ED for red flag symptoms, including; fevers you cannot reduce with Tylenol/Motrin, severe headaches, vision changes, numbness/weakness in part of the body, lethargy, confusion, intractable vomiting, severe dehydration, chest pain, breathing difficulty, severe persistent abdominal or pelvic pain, signs of severe infection (increased redness, swelling of an area), feeling faint or passing out, dizziness, etc. You should especially go to the ED for sudden acute worsening of condition if you do not elect to go at this time.       ED Prescriptions   None    PDMP not reviewed  this encounter.   Lyndee Hensen, DO 08/18/22 1709

## 2022-08-18 NOTE — ED Triage Notes (Signed)
Pt c/o hypotensions, blurred vision and dizziness x 1 week. Pt states he symptoms were worse yesterday.

## 2022-08-18 NOTE — Discharge Instructions (Signed)
You have an low sodium and an elevated white blood cell count. Considering you have dizziness, I am unsure if there is an acute issue with your brain or eyes. You have been advised to follow up immediately in the emergency department for concerning signs or symptoms as discussed during your visit. If you declined EMS transport, please have a family member take you directly to the ED at this time. Do not delay.   Based on concerns about condition, if you do not follow up in the ED, you may risk poor outcomes including worsening of condition, delayed treatment and potentially life threatening issues. If you have declined to go to the ED at this time, you should call your PCP immediately to set up a follow up appointment.   Go to ED for red flag symptoms, including; fevers you cannot reduce with Tylenol/Motrin, severe headaches, vision changes, numbness/weakness in part of the body, lethargy, confusion, intractable vomiting, severe dehydration, chest pain, breathing difficulty, severe persistent abdominal or pelvic pain, signs of severe infection (increased redness, swelling of an area), feeling faint or passing out, dizziness, etc. You should especially go to the ED for sudden acute worsening of condition if you do not elect to go at this time.

## 2022-08-18 NOTE — ED Triage Notes (Signed)
Pt arrives with c/o dizziness and blurred vision in both eyes for about a week. Pt sent here from UC for imaging. Pt denies n/v, SOB, or CP.

## 2022-08-18 NOTE — Discharge Instructions (Addendum)
Your exam, labs, CT scan all normal and reassuring at this time.  Your symptoms may be related to benign positional vertigo, versus symptoms from your lower blood pressure readings as of late.  Take the prescription medications as directed.  Follow-up with primary provider as discussed for reevaluation of your blood pressure medicines.  Continue to monitor and track your blood pressure readings until that time.  Return to the ED if needed.

## 2022-08-18 NOTE — ED Notes (Signed)
Patient is being discharged from the Urgent Care and sent to the Emergency Department via [facility] . Per Dr. Susa Simmonds, patient is in need of higher level of care due to dizziness, hyponatremia. Patient is aware and verbalizes understanding of plan of care.  Vitals:   08/18/22 1538  BP: 108/71  Pulse: 86  Resp: 16  Temp: 98 F (36.7 C)  SpO2: 99%

## 2022-08-19 NOTE — ED Notes (Signed)
E-signature pad unavailable - Pt verbalized understanding of D/C information - no additional concerns at this time.  

## 2022-10-28 ENCOUNTER — Ambulatory Visit
Admission: EM | Admit: 2022-10-28 | Discharge: 2022-10-28 | Disposition: A | Discharge status: Home / Self Care | Payer: BC Managed Care – PPO | Attending: Emergency Medicine | Admitting: Emergency Medicine

## 2022-10-28 DIAGNOSIS — J0140 Acute pansinusitis, unspecified: Secondary | ICD-10-CM | POA: Diagnosis not present

## 2022-10-28 MED ORDER — BENZONATATE 100 MG PO CAPS: 100.0000 mg | ORAL_CAPSULE | Freq: Three times a day (TID) | ORAL | 0 refills | Status: DC

## 2022-10-28 MED ORDER — CEFDINIR 300 MG PO CAPS: 300.0000 mg | ORAL_CAPSULE | Freq: Two times a day (BID) | ORAL | 0 refills | Status: AC

## 2022-10-28 MED ORDER — PROMETHAZINE-DM 6.25-15 MG/5ML PO SYRP: 5.0000 mL | ORAL_SOLUTION | Freq: Four times a day (QID) | ORAL | 0 refills | Status: DC | PRN

## 2022-10-28 NOTE — ED Triage Notes (Signed)
Pt c/o nasal congestion, cough [phone]

## 2022-10-28 NOTE — ED Provider Notes (Addendum)
[facility]    CSN: [id number] Arrival date & time: 10/28/22  1551      History   Chief Complaint Chief Complaint  Patient presents with   Nasal Congestion   Cough    HPI Tiffany Andrade is a 46 y.o. female.   Patient presents for evaluation of fever, chills, nasal congestion, rhinorrhea, sore throat, body aches, cough beginning 15 days ago.  Has been known to experience shortness of breath with exertion and wheezing today.  Fever has resolved.  Decreased appetite but tolerating food and liquids.  Possible sick contacts at work.   shift has attempted use of NyQuil, TheraFlu and sinus max with minimal relief.    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   Right leg pain 04/14/2018   Migraine without aura and without status migrainosus, not intractable 06/03/2017   Chronic cystitis 04/06/2017   Hematuria, microscopic 04/06/2017   Incomplete emptying of bladder 04/06/2017   Ureteral mass 04/06/2017   Unilateral ureteral reflux 04/06/2017   Right arm pain 11/12/2013   Anemia 09/25/2013   Constipation 09/25/2013   Dysmenorrhea 09/25/2013   Menorrhagia 09/25/2013   Hematochezia 05/08/2013   Obstructive sleep apnea 12/26/2012   Essential (primary) hypertension 10/19/2012   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      Home Medications    Prior to Admission medications   Medication Sig Start Date End Date Taking? Authorizing Provider  albuterol (VENTOLIN HFA) 108 (90 Base) MCG/ACT inhaler Inhale into the lungs. 10/16/20  Yes [provider]  amLODipine (NORVASC) 5 MG tablet Take by mouth. 09/26/18  Yes [provider]  B Complex-C-Calcium (GNP B-COMPLEX PLUS VITAMIN C) TABS Take by mouth.   Yes [provider]  beclomethasone (QVAR REDIHALER) 40 MCG/ACT inhaler Inhale into the lungs. 01/18/21  Yes [provider]  busPIRone (BUSPAR) 10 MG tablet Take 5 mg by mouth 2 (two) times daily. 11/09/19  Yes [provider]  candesartan (ATACAND) 32 MG tablet Take by mouth. 12/26/20  Yes [provider]  cyclobenzaprine (FLEXERIL) 10 MG tablet Take 10 mg by mouth at bedtime as needed. 12/23/19  Yes [provider]  DULoxetine (CYMBALTA) 60 MG capsule Take 60 mg by mouth daily.   Yes [provider]  fluticasone (FLONASE) 50 MCG/ACT nasal spray Place 1 spray into both nostrils daily. 05/20/20  Yes [provider]  Galcanezumab-gnlm 120 MG/ML SOAJ Inject into the skin. 12/29/21  Yes [provider]  hydrOXYzine (VISTARIL) 25 MG capsule Take 25-50 mg by mouth 2 (two) times daily as needed. 01/23/21  Yes [provider]  lidocaine (LIDODERM) 5 % SMARTSIG:Patch(s) Topical 03/06/21  Yes [provider]  MAGNESIUM PO Take by mouth.   Yes [provider]  meclizine (MEDI-MECLIZINE) 25 MG tablet Take 1 tablet (25 mg total) by mouth 3 (three) times daily as needed for dizziness. 08/18/22  Yes Menshew, Dannielle Karvonen, PA-C  meloxicam (MOBIC) 15 MG tablet Take by mouth. 09/14/19  Yes [provider]  methocarbamol (ROBAXIN) 500 MG tablet Take 500 mg by mouth 2  (two) times daily as needed. 01/31/22  Yes [provider]  mupirocin nasal ointment (BACTROBAN) 2 % Apply to wound 3 times a day. 09/20/20  Yes Margarette Canada, NP  nystatin ointment (MYCOSTATIN) Apply topically.   Yes [provider]  Olopatadine HCl 0.2 % SOLN Apply 1 drop to eye daily. 05/12/20  Yes [provider]  ondansetron (ZOFRAN-ODT) 4 MG disintegrating tablet Take 1 tablet (4 mg total) by mouth every 8 (eight) hours as needed for nausea or vomiting. 08/18/22  Yes Menshew, Dannielle Karvonen, PA-C  polyethylene glycol powder (GLYCOLAX/MIRALAX) 17 GM/SCOOP powder Take by mouth.   Yes [provider]  POTASSIUM PO Take by mouth.   Yes [provider]  rizatriptan (MAXALT-MLT) 10 MG disintegrating tablet Give one tablet may repeat in 2 hours if needed.  No more than 4 in 1 week 04/14/18  Yes [provider]  senna (SENOKOT) 8.6 MG tablet Take 2 tablets by mouth at bedtime.   Yes [provider]  sertraline (ZOLOFT) 100 MG tablet Take 1 tablet by mouth daily. 03/20/21  Yes [provider]  sertraline (ZOLOFT) 50 MG tablet PLEASE SEE ATTACHED FOR DETAILED DIRECTIONS 09/11/21  Yes [provider]  solifenacin (VESICARE) 5 MG tablet Take 1 tablet by mouth daily. 01/14/22  Yes [provider]  Spacer/Aero-Holding Chambers (AEROCHAMBER PLUS) inhaler Use as instructed 11/14/19  Yes Melynda Ripple, MD  spironolactone (ALDACTONE) 50 MG tablet Take 50 mg by mouth daily.   Yes [provider]  valACYclovir (VALTREX) 1000 MG tablet Take 1 tablet (1,000 mg total) by mouth 3 (three) times daily. 05/09/22  Yes Danton Clap, PA-C  cetirizine (ZYRTEC) 10 MG tablet Take 10 mg by mouth daily. 03/19/21   [provider]  OZEMPIC, 0.25 OR 0.5 MG/DOSE, 2 MG/1.5ML SOPN Inject into the skin. 05/20/21   [provider]  amitriptyline (ELAVIL) 25 MG tablet Take 25 mg by mouth at bedtime as needed for sleep.   11/14/19  [provider]  lisinopril-hydrochlorothiazide (PRINZIDE,ZESTORETIC) 20-25 MG tablet Take 1 tablet by mouth daily.  02/02/21  [provider]  topiramate (TOPAMAX) 25 MG tablet Please start at '25mg'$  nightly for a week. You may increase to '25mg'$  twice a day a week later if you are tolerating this well. You can increase to three times per day a week later if you are still tolerating this well. 05/01/20 09/20/20  [provider]    Family History Family History  Problem Relation Age of Onset   Hypertension Mother    Hypertension Sister    Hypertension Brother     Social History Social History   Tobacco Use   Smoking status: Never   Smokeless tobacco: Never  Vaping Use   Vaping Use: Never used  Substance Use Topics   Alcohol use: No    Alcohol/week: 0.0 standard drinks of alcohol   Drug use: No     Allergies   Levofloxacin, Oxybutynin, Codeine, Iodinated contrast media,  Other, Penicillins, Sulfa antibiotics, and Tape   Review of Systems Review of Systems  Constitutional:  Positive for chills and fever. Negative for activity change, appetite change, diaphoresis, fatigue and unexpected weight change.  HENT:  Positive for congestion, rhinorrhea and sore throat. Negative for dental problem, drooling, ear discharge, ear pain, facial swelling, hearing loss, mouth sores, nosebleeds, postnasal drip, sinus pressure, sinus pain, sneezing, tinnitus, trouble swallowing and voice change.   Respiratory:  Positive for cough, shortness of breath and wheezing. Negative for apnea, choking, chest tightness and stridor.   Musculoskeletal:  Positive for myalgias. Negative for arthralgias, back pain, gait problem, joint swelling, neck pain and neck stiffness.     Physical Exam Triage Vital Signs ED Triage Vitals  Enc Vitals Group     BP 10/28/22 1722 136/89     Pulse Rate 10/28/22 1722 82     Resp 10/28/22 1722 18     Temp 10/28/22 1722 97.9 F (36.6 C)     Temp  Source 10/28/22 1722 Oral     SpO2 10/28/22 1722 97 %     Weight 10/28/22 1720 195 lb (88.5 kg)     Height 10/28/22 1720 '4\' 11"'$  (1.499 m)     Head Circumference --      Peak Flow --      Pain Score 10/28/22 1720 10     Pain Loc --      Pain Edu? --      Excl. in [place]? --    No data found.  Updated Vital Signs BP 136/89 (BP Location: Left Arm)   Pulse 82   Temp 97.9 F (36.6 C) (Oral)   Resp 18   Ht '4\' 11"'$  (1.499 m)   Wt 195 lb (88.5 kg)   LMP 10/25/2016   SpO2 97%   BMI 39.39 kg/m   Visual Acuity Right Eye Distance:   Left Eye Distance:   Bilateral Distance:    Right Eye Near:   Left Eye Near:    Bilateral Near:     Physical Exam Constitutional:      Appearance: Normal appearance.  HENT:     Head: Normocephalic.     Right Ear: Tympanic membrane, ear canal and external ear normal.     Left Ear: Tympanic membrane, ear canal and external ear normal.     Nose: Congestion and rhinorrhea present.     Right Sinus: Maxillary sinus tenderness and frontal sinus tenderness present.     Left Sinus: Maxillary sinus tenderness and frontal sinus tenderness present.     Mouth/Throat:     Mouth: Mucous membranes are moist.     Pharynx: Posterior oropharyngeal erythema present.  Cardiovascular:     Rate and Rhythm: Normal rate and regular rhythm.     Pulses: Normal pulses.     Heart sounds: Normal heart sounds.  Pulmonary:     Effort: Pulmonary effort is normal.     Breath sounds: Normal breath sounds.  Skin:    General: Skin is warm and dry.  Neurological:     Mental Status: She is alert and oriented to person, place, and time. Mental status is at baseline.      UC Treatments / Results  Labs (all labs ordered are listed, but only abnormal results are displayed) Labs Reviewed - No data to display  EKG   Radiology No results found.  Procedures Procedures (including critical care time)  Medications Ordered in UC Medications - No data to display  Initial  Impression / Assessment and Plan / UC Course  I have reviewed the triage vital signs and the nursing notes.  Pertinent labs & imaging results that were available during my care of the patient were reviewed by me and considered in my medical decision making (see chart for details).  Acute nonrecurrent pansinusitis  Vital signs are stable, while ill-appearing patient is in no signs of or toxic, presentation and symptomology is consistent with a sinusitis and therefore will provide coverage for bacteria as symptoms have been present for 15 days without signs of resolution as well as Tessalon and Promethazine DM for additional supportive care, may use over-the-counter medications as needed with urgent care follow-up as needed Final Clinical Impressions(s) / UC Diagnoses   Final diagnoses:  None   Discharge Instructions   None    ED Prescriptions   None    PDMP not reviewed this encounter.   Hans Eden, NP 10/28/22 1745    Hans Eden, NP 10/28/22 1745

## 2022-10-28 NOTE — Discharge Instructions (Signed)
Today you are being treated for sinus infection as her symptoms have progressed for 2 weeks we will provide coverage for bacteria which is most likely prolonged and your illness  Begin use of cefdinir every morning and every evening for 10 days, ideally you will start to see improvement after 48 hours and steady progression from there  May use Tessalon pill every 8 hours to help calm your coughing  May use cough syrup every 6 hours as needed for additional comfort, be mindful this will make you drowsy    You can take Tylenol and/or Ibuprofen as needed for fever reduction and pain relief.   For cough: honey 1/2 to 1 teaspoon (you can dilute the honey in water or another fluid).  You can also use guaifenesin and dextromethorphan for cough. You can use a humidifier for chest congestion and cough.  If you don't have a humidifier, you can sit in the bathroom with the hot shower running.      For sore throat: try warm salt water gargles, cepacol lozenges, throat spray, warm tea or water with lemon/honey, popsicles or ice, or OTC cold relief medicine for throat discomfort.   For congestion: take a daily anti-histamine like Zyrtec, Claritin, and a oral decongestant, such as pseudoephedrine.  You can also use Flonase 1-2 sprays in each nostril daily.   It is important to stay hydrated: drink plenty of fluids (water, gatorade/powerade/pedialyte, juices, or teas) to keep your throat moisturized and help further relieve irritation/discomfort.

## 2022-10-30 ENCOUNTER — Ambulatory Visit
Admission: EM | Admit: 2022-10-30 | Discharge: 2022-10-30 | Disposition: A | Discharge status: Home / Self Care | Payer: BC Managed Care – PPO | Attending: Emergency Medicine | Admitting: Emergency Medicine

## 2022-10-30 ENCOUNTER — Encounter: Payer: Self-pay | Admitting: Emergency Medicine

## 2022-10-30 ENCOUNTER — Ambulatory Visit (INDEPENDENT_AMBULATORY_CARE_PROVIDER_SITE_OTHER): Payer: BC Managed Care – PPO

## 2022-10-30 DIAGNOSIS — R052 Subacute cough: Secondary | ICD-10-CM

## 2022-10-30 DIAGNOSIS — R0989 Other specified symptoms and signs involving the circulatory and respiratory systems: Secondary | ICD-10-CM

## 2022-10-30 DIAGNOSIS — R059 Cough, unspecified: Secondary | ICD-10-CM

## 2022-10-30 MED ORDER — ALBUTEROL SULFATE HFA 108 (90 BASE) MCG/ACT IN AERS: 2.0000 | INHALATION_SPRAY | RESPIRATORY_TRACT | 0 refills | Status: AC | PRN

## 2022-10-30 MED ORDER — AEROCHAMBER MV MISC: 2 refills | Status: AC

## 2022-10-30 NOTE — ED Triage Notes (Signed)
Patient c/o cough and chest congestion for 15 days.  Patient was seen on Thursday at [facility]  Patient is currently on antibiotic and cough medicines.

## 2022-10-30 NOTE — ED Provider Notes (Signed)
[facility]    CSN: [id number] Arrival date & time: 10/30/22  1219      History   Chief Complaint Chief Complaint  Patient presents with   Cough    HPI Tiffany Andrade is a 46 y.o. female.   HPI  46 year old female here for reevaluation of respiratory complaints.  The patient reports that she has been experiencing a cough for last 15 days.  She was seen in this urgent care 3 days ago and diagnosed with pansinusitis and discharged home on cough medication and cefdinir.  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  She has not had a fever but she has had some chills.  She has shortness breath and wheezing.  She has been prescribed an albuterol inhaler in the past but she does not currently have 1.  Patient is able to speak in full sentence without dyspnea or tachypnea.  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   Right leg pain 04/14/2018   Migraine without aura and without status migrainosus, not intractable 06/03/2017   Chronic cystitis 04/06/2017   Hematuria, microscopic 04/06/2017   Incomplete emptying of bladder 04/06/2017   Ureteral mass 04/06/2017   Unilateral ureteral reflux 04/06/2017   Right arm pain 11/12/2013    Anemia 09/25/2013   Constipation 09/25/2013   Dysmenorrhea 09/25/2013   Menorrhagia 09/25/2013   Hematochezia 05/08/2013   Obstructive sleep apnea 12/26/2012   Essential (primary) hypertension 10/19/2012   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      Home Medications    Prior to Admission medications   Medication Sig Start Date End Date Taking? Authorizing Provider  albuterol (VENTOLIN HFA) 108 (90 Base) MCG/ACT inhaler Inhale 2 puffs into the lungs every 4 (four) hours as needed. 10/30/22  Yes Margarette Canada, NP  Spacer/Aero-Holding Chambers (AEROCHAMBER MV) inhaler Use as instructed 10/30/22  Yes Margarette Canada, NP  amLODipine (NORVASC) 5 MG tablet Take by mouth. 09/26/18   [provider]  B Complex-C-Calcium (GNP B-COMPLEX PLUS VITAMIN C) TABS Take by mouth.    [provider]  beclomethasone (QVAR REDIHALER) 40 MCG/ACT inhaler Inhale into the lungs. 01/18/21   [provider]  benzonatate (TESSALON) 100 MG capsule Take 1 capsule (100 mg total) by mouth every 8 (eight) hours. 10/28/22   Hans Eden, NP  busPIRone (BUSPAR)  10 MG tablet Take 5 mg by mouth 2 (two) times daily. 11/09/19   [provider]  candesartan (ATACAND) 32 MG tablet Take by mouth. 12/26/20   [provider]  cefdinir (OMNICEF) 300 MG capsule Take 1 capsule (300 mg total) by mouth 2 (two) times daily for 10 days. 10/28/22 11/07/22  Hans Eden, NP  cetirizine (ZYRTEC) 10 MG tablet Take 10 mg by mouth daily. 03/19/21   [provider]  cyclobenzaprine (FLEXERIL) 10 MG tablet Take 10 mg by mouth at bedtime as needed. 12/23/19   [provider]  DULoxetine (CYMBALTA) 60 MG capsule Take  60 mg by mouth daily.    [provider]  fluticasone (FLONASE) 50 MCG/ACT nasal spray Place 1 spray into both nostrils daily. 05/20/20   [provider]  Galcanezumab-gnlm 120 MG/ML SOAJ Inject into the skin. 12/29/21   [provider]  hydrOXYzine (VISTARIL) 25 MG capsule Take 25-50 mg by mouth 2 (two) times daily as needed. 01/23/21   [provider]  lidocaine (LIDODERM) 5 % SMARTSIG:Patch(s) Topical 03/06/21   [provider]  MAGNESIUM PO Take by mouth.    [provider]  meclizine (MEDI-MECLIZINE) 25 MG tablet Take 1 tablet (25 mg total) by mouth 3 (three) times daily as needed for dizziness. 08/18/22   Menshew, Dannielle Karvonen, PA-C  meloxicam (MOBIC) 15 MG tablet Take by mouth. 09/14/19   [provider]  methocarbamol (ROBAXIN) 500 MG tablet Take 500 mg by mouth 2 (two) times daily as needed. 01/31/22   [provider]  mupirocin nasal ointment (BACTROBAN) 2 % Apply to wound 3 times a day. 09/20/20   Margarette Canada, NP  nystatin ointment (MYCOSTATIN) Apply topically.    [provider]  Olopatadine HCl 0.2 % SOLN Apply 1 drop to eye daily. 05/12/20   [provider]  ondansetron (ZOFRAN-ODT) 4 MG disintegrating tablet Take 1 tablet (4 mg total) by mouth every 8 (eight) hours as needed for nausea or vomiting. 08/18/22   Menshew, Dannielle Karvonen, PA-C  OZEMPIC, 0.25 OR 0.5 MG/DOSE, 2 MG/1.5ML SOPN Inject into the skin. 05/20/21   [provider]  polyethylene glycol powder (GLYCOLAX/MIRALAX) 17 GM/SCOOP powder Take by mouth.    [provider]  POTASSIUM PO Take by mouth.    [provider]  promethazine-dextromethorphan (PROMETHAZINE-DM) 6.25-15 MG/5ML syrup Take 5 mLs by mouth 4 (four) times daily as needed for cough. 10/28/22   Hans Eden, NP  rizatriptan (MAXALT-MLT) 10 MG disintegrating tablet Give one tablet may repeat in 2 hours if needed.  No more than 4 in 1 week 04/14/18    [provider]  senna (SENOKOT) 8.6 MG tablet Take 2 tablets by mouth at bedtime.    [provider]  sertraline (ZOLOFT) 100 MG tablet Take 1 tablet by mouth daily. 03/20/21   [provider]  sertraline (ZOLOFT) 50 MG tablet PLEASE SEE ATTACHED FOR DETAILED DIRECTIONS 09/11/21   [provider]  solifenacin (VESICARE) 5 MG tablet Take 1 tablet by mouth daily. 01/14/22   [provider]  spironolactone (ALDACTONE) 50 MG tablet Take 50 mg by mouth daily.    [provider]  valACYclovir (VALTREX) 1000 MG tablet Take 1 tablet (1,000 mg total) by mouth 3 (three) times daily. 05/09/22   Danton Clap, PA-C  amitriptyline (ELAVIL) 25 MG tablet Take 25 mg by mouth at bedtime as needed for sleep.  11/14/19  [provider]  lisinopril-hydrochlorothiazide (  PRINZIDE,ZESTORETIC) 20-25 MG tablet Take 1 tablet by mouth daily.  02/02/21  [provider]  topiramate (TOPAMAX) 25 MG tablet Please start at '25mg'$  nightly for a week. You may increase to '25mg'$  twice a day a week later if you are tolerating this well. You can increase to three times per day a week later if you are still tolerating this well. 05/01/20 09/20/20  [provider]    Family History Family History  Problem Relation Age of Onset   Hypertension Mother    Hypertension Sister    Hypertension Brother     Social History Social History   Tobacco Use   Smoking status: Never   Smokeless tobacco: Never  Vaping Use   Vaping Use: Never used  Substance Use Topics   Alcohol use: No    Alcohol/week: 0.0 standard drinks of alcohol   Drug use: No     Allergies   Levofloxacin, Oxybutynin, Codeine, Iodinated contrast media, Other, Penicillins, Sulfa antibiotics, and Tape   Review of Systems Review of Systems  Constitutional:  Positive for chills. Negative for fever.  HENT:  Positive for congestion, nosebleeds and rhinorrhea.   Respiratory:  Positive for cough,  shortness of breath and wheezing.      Physical Exam Triage Vital Signs ED Triage Vitals  Enc Vitals Group     BP 10/30/22 1329 (!) 145/94     Pulse Rate 10/30/22 1329 90     Resp 10/30/22 1329 14     Temp 10/30/22 1329 98.6 F (37 C)     Temp Source 10/30/22 1329 Oral     SpO2 10/30/22 1329 98 %     Weight 10/30/22 1327 195 lb (88.5 kg)     Height 10/30/22 1327 '4\' 11"'$  (1.499 m)     Head Circumference --      Peak Flow --      Pain Score 10/30/22 1327 0     Pain Loc --      Pain Edu? --      Excl. in [place]? --    No data found.  Updated Vital Signs BP (!) 145/94 (BP Location: Left Arm)   Pulse 90   Temp 98.6 F (37 C) (Oral)   Resp 14   Ht '4\' 11"'$  (1.499 m)   Wt 195 lb (88.5 kg)   LMP 10/25/2016   SpO2 98%   BMI 39.39 kg/m   Visual Acuity Right Eye Distance:   Left Eye Distance:   Bilateral Distance:    Right Eye Near:   Left Eye Near:    Bilateral Near:     Physical Exam Vitals and nursing note reviewed.  Constitutional:      Appearance: Normal appearance. She is not ill-appearing.  HENT:     Head: Normocephalic and atraumatic.     Right Ear: Tympanic membrane, ear canal and external ear normal. There is no impacted cerumen.     Left Ear: Tympanic membrane, ear canal and external ear normal. There is no impacted cerumen.     Nose: Congestion and rhinorrhea present.     Comments: Significant erythema and edema of nasal mucosa.  No blood or bloody discharge noted.    Mouth/Throat:     Mouth: Mucous membranes are moist.     Pharynx: Oropharynx is clear. Posterior oropharyngeal erythema present. No oropharyngeal exudate.     Comments: Erythema and injection the posterior oropharynx.  No blood noted in the posterior oropharynx. Cardiovascular:     Rate and  Rhythm: Normal rate and regular rhythm.     Pulses: Normal pulses.     Heart sounds: Normal heart sounds. No murmur heard.    No friction rub. No gallop.  Pulmonary:     Effort: Pulmonary effort is  normal.     Breath sounds: Wheezing present. No rhonchi or rales.     Comments: Patient does have scattered wheezing with coughing but it clears when she is not coughing throughout her lung fields.  No rales or rhonchi appreciated. Musculoskeletal:     Cervical back: Normal range of motion and neck supple.  Lymphadenopathy:     Cervical: No cervical adenopathy.  Skin:    General: Skin is warm and dry.     Capillary Refill: Capillary refill takes less than 2 seconds.  Neurological:     General: No focal deficit present.     Mental Status: She is alert and oriented to person, place, and time.  Psychiatric:        Mood and Affect: Mood normal.        Behavior: Behavior normal.        Thought Content: Thought content normal.        Judgment: Judgment normal.      UC Treatments / Results  Labs (all labs ordered are listed, but only abnormal results are displayed) Labs Reviewed - No data to display  EKG   Radiology DG Chest 2 View  Result Date: 10/30/2022 CLINICAL DATA:  Cough, chest congestion EXAM: CHEST - 2 VIEW COMPARISON:  Previous studies including the chest radiographs done on 08/12/2016 FINDINGS: The heart size and mediastinal contours are within normal limits. Both lungs are clear. The visualized skeletal structures are unremarkable. IMPRESSION: No active cardiopulmonary disease. Electronically Signed   By: Elmer Picker M.D.   On: 10/30/2022 14:35    Procedures Procedures (including critical care time)  Medications Ordered in UC Medications - No data to display  Initial Impression / Assessment and Plan / UC Course  I have reviewed the triage vital signs and the nursing notes.  Pertinent labs & imaging results that were available during my care of the patient were reviewed by me and considered in my medical decision making (see chart for details).   Patient is a nontoxic-appearing 46 year old female with a significant past medical history presenting for  reevaluation of respiratory symptoms that been going on for the past 15 days.  As stated in the HPI above, she was seen in this urgent care 2 days ago diagnosed with pansinusitis and discharged home on cough medicine and cefdinir for treatment of pansinusitis.  This morning she had a nosebleed and she had some streaks of blood in her sputum that she coughed up.  She also learned that there was RSV running through her place of employment and she was concerned and wanted to be reevaluated.  She has a significant amount of erythema and edema to her nasal mucosa and the friable tissues [place] selves to capillary rupture and nosebleed.  There is no active nosebleed at present and there is no bloody discharge noted in the nasal passages.  There is also no blood in the posterior oropharynx.  Patient's lung sounds have scattered wheezes when she coughs but are otherwise clear.  I will obtain a chest x-ray just to rule out any acute cardiopulmonary process.  If her chest x-ray is negative I will discharge her home and have her continue her therapy of cough syrup and cefdinir.  I will also  refill her albuterol inhaler for help with her shortness of breath and wheezing.  Chest x-ray independent reviewed and evaluated by me.  Impression: Lung fields are well aerated.  There is no appreciable infiltrate.  Radiology overread is pending. Radiology impression states no active cardiopulmonary disease.  I will discharge patient home and have her continue her therapy for her pansinusitis and we will add on an albuterol inhaler that she can use for shortness breath and wheezing.  I will also send over prescription for a spacer.   Final Clinical Impressions(s) / UC Diagnoses   Final diagnoses:  Subacute cough     Discharge Instructions      Your chest x-ray today did not show any evidence of pneumonia.  As we discussed, your nosebleed is most likely secondary to the amount of inflammation you have in your nasal  passages from your sinus infection.  The stricture blood that she noticed in your sputum that she coughed up her most likely also from ruptured capillaries due to the fact that she has had a cough for 15 days.  Continue your cefdinir and cough medication as previously prescribed.  Use the albuterol inhaler with a spacer, 2 puffs every 4-6 hours, as needed for shortness breath and wheezing.     ED Prescriptions     Medication Sig Dispense Auth. Provider   albuterol (VENTOLIN HFA) 108 (90 Base) MCG/ACT inhaler Inhale 2 puffs into the lungs every 4 (four) hours as needed. 18 g Margarette Canada, NP   Spacer/Aero-Holding Chambers (AEROCHAMBER MV) inhaler Use as instructed 1 each Margarette Canada, NP      PDMP not reviewed this encounter.   Margarette Canada, NP 10/30/22 [phone]

## 2022-10-30 NOTE — Discharge Instructions (Addendum)
Your chest x-ray today did not show any evidence of pneumonia.  As we discussed, your nosebleed is most likely secondary to the amount of inflammation you have in your nasal passages from your sinus infection.  The stricture blood that she noticed in your sputum that she coughed up her most likely also from ruptured capillaries due to the fact that she has had a cough for 15 days.  Continue your cefdinir and cough medication as previously prescribed.  Use the albuterol inhaler with a spacer, 2 puffs every 4-6 hours, as needed for shortness breath and wheezing.

## 2022-11-20 ENCOUNTER — Encounter: Payer: Self-pay | Admitting: Emergency Medicine

## 2022-11-20 ENCOUNTER — Ambulatory Visit
Admission: EM | Admit: 2022-11-20 | Discharge: 2022-11-20 | Disposition: A | Discharge status: Home / Self Care | Payer: BC Managed Care – PPO | Attending: Physician Assistant | Admitting: Physician Assistant

## 2022-11-20 DIAGNOSIS — N760 Acute vaginitis: Secondary | ICD-10-CM | POA: Insufficient documentation

## 2022-11-20 DIAGNOSIS — N3000 Acute cystitis without hematuria: Secondary | ICD-10-CM | POA: Diagnosis present

## 2022-11-20 DIAGNOSIS — R350 Frequency of micturition: Secondary | ICD-10-CM | POA: Insufficient documentation

## 2022-11-20 DIAGNOSIS — R300 Dysuria: Secondary | ICD-10-CM | POA: Insufficient documentation

## 2022-11-20 LAB — WET PREP, GENITAL
Sperm: NONE SEEN
Trich, Wet Prep: NONE SEEN
WBC, Wet Prep HPF POC: >10 — AB (ref <10)

## 2022-11-20 LAB — URINALYSIS, ROUTINE W REFLEX MICROSCOPIC
Bilirubin Urine: NEGATIVE
Glucose, UA: NEGATIVE mg/dL
Ketones, ur: NEGATIVE mg/dL
Nitrite: NEGATIVE
Protein, ur: NEGATIVE mg/dL
Specific Gravity, Urine: 1.015 (ref 1.005–1.030)
pH: 7 (ref 5.0–8.0)

## 2022-11-20 LAB — URINALYSIS, MICROSCOPIC (REFLEX)

## 2022-11-20 MED ORDER — SULFAMETHOXAZOLE-TRIMETHOPRIM 800-160 MG PO TABS: 1.0000 | ORAL_TABLET | Freq: Two times a day (BID) | ORAL | 0 refills | Status: AC

## 2022-11-20 MED ORDER — FLUCONAZOLE 150 MG PO TABS: ORAL_TABLET | ORAL | 0 refills | Status: DC

## 2022-11-20 MED ORDER — METRONIDAZOLE 500 MG PO TABS: 500.0000 mg | ORAL_TABLET | Freq: Two times a day (BID) | ORAL | 0 refills | Status: AC

## 2022-11-20 NOTE — ED Triage Notes (Signed)
Patient c/o lower abdominal pain and urinary urgency that started on Thursday.  Patient has been on recent antibiotics for Pneumonia.

## 2022-11-20 NOTE — Discharge Instructions (Signed)
UTI: Based on either symptoms or urinalysis, you may have a urinary tract infection. We will send the urine for culture and call with results in a few days. Begin antibiotics at this time. Your symptoms should be much improved over the next 2-3 days. Increase rest and fluid intake. If for some reason symptoms are worsening or not improving after a couple of days or the urine culture determines the antibiotics you are taking will not treat the infection, the antibiotics may be changed. Return or go to ER for fever, back pain, worsening urinary pain, discharge, increased blood in urine. May take Tylenol or Motrin OTC for pain relief or consider AZO if no contraindications   The most common types of vaginal infections are yeast infections and bacterial vaginosis. Neither of which are really considered to be sexually transmitted. Often a pH swab or wet prep is performed and if abnormal may reveal either type of infection. Begin metronidazole if prescribed for possible BV infection. If there is concern for yeast infection, fluconazole is often prescribed . Take this as directed. You may also apply topical miconazole (can be purchased OTC) externally for relief of itching. Increase rest and fluid intake. If labs sent out, we will call within 2-5 days with results and amend treatment if necessary. Always try to use pH balanced washes/wipes, urinate after intercourse, stay hydrated, and take probiotics if you are prone to vaginal infections. Return or see PCP or gynecologist for new/worsening infections.   

## 2022-11-20 NOTE — ED Provider Notes (Signed)
[facility]    CSN: [id number] Arrival date & time: 11/20/22  1309      History   Chief Complaint Chief Complaint  Patient presents with   Urinary Frequency   Abdominal Pain    HPI Tiffany Andrade is a 47 y.o. female presenting for dysuria, frequency and urgency for the past 2 days.  She says she believes she has a UTI.  Reports frequent UTIs and says she gets 6 to 7-year and the only antibiotic that works is Bactrim DS.  She has had some left-sided flank pain but that has been off and on for the past month.  She says she thought that was related to a cough.  She has been treated for bacterial respiratory infections.  Reports taking cefdinir followed by azithromycin.  Reports completing azithromycin 4 days ago.  She is not reporting any vaginal discharge or itching.  She has not had any fever.  No other concerns.  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   Right leg pain 04/14/2018   Migraine without aura and without status migrainosus, not intractable 06/03/2017   Chronic cystitis 04/06/2017   Hematuria, microscopic 04/06/2017   Incomplete emptying of bladder 04/06/2017   Ureteral mass 04/06/2017   Unilateral ureteral reflux 04/06/2017   Right arm pain 11/12/2013   Anemia 09/25/2013   Constipation 09/25/2013   Dysmenorrhea 09/25/2013   Menorrhagia 09/25/2013   Hematochezia 05/08/2013   Obstructive sleep apnea 12/26/2012    Essential (primary) hypertension 10/19/2012   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      Home Medications    Prior to Admission medications   Medication Sig Start Date End Date Taking? Authorizing Provider  fluconazole (DIFLUCAN) 150 MG tablet Take 1 tab p.o. every 72 hours for yeast infection 11/20/22  Yes Laurene Footman B, PA-C  metroNIDAZOLE (FLAGYL) 500 MG tablet Take 1 tablet (500 mg total) by mouth 2 (two) times daily for 7 days. 11/20/22 11/27/22 Yes Laurene Footman B, PA-C  sulfamethoxazole-trimethoprim (BACTRIM DS) 800-160 MG tablet Take 1 tablet by mouth 2 (two) times daily for 7 days. 11/20/22 11/27/22 Yes Danton Clap, PA-C  albuterol (VENTOLIN HFA) 108 (90 Base) MCG/ACT inhaler Inhale 2 puffs into the lungs every 4 (four) hours as needed. 10/30/22   Margarette Canada, NP  amLODipine (NORVASC) 5 MG tablet Take by mouth. 09/26/18   [provider]  B Complex-C-Calcium (GNP B-COMPLEX PLUS VITAMIN C) TABS Take by mouth.    [provider]  beclomethasone (QVAR REDIHALER) 40 MCG/ACT inhaler Inhale into the lungs. 01/18/21  [provider]  benzonatate (TESSALON) 100 MG capsule Take 1 capsule (100 mg total) by mouth every 8 (eight) hours. 10/28/22   White, Leitha Schuller, NP  busPIRone (BUSPAR) 10 MG tablet Take 5 mg by mouth 2 (two) times daily. 11/09/19   [provider]  candesartan (ATACAND) 32 MG tablet Take by mouth. 12/26/20   [provider]  cetirizine (ZYRTEC) 10 MG tablet Take 10 mg by mouth daily. 03/19/21   [provider]  cyclobenzaprine (FLEXERIL) 10 MG tablet Take 10 mg by mouth at bedtime as needed. 12/23/19   [provider]  DULoxetine (CYMBALTA) 60  MG capsule Take 60 mg by mouth daily.    [provider]  fluticasone (FLONASE) 50 MCG/ACT nasal spray Place 1 spray into both nostrils daily. 05/20/20   [provider]  Galcanezumab-gnlm 120 MG/ML SOAJ Inject into the skin. 12/29/21   [provider]  hydrOXYzine (VISTARIL) 25 MG capsule Take 25-50 mg by mouth 2 (two) times daily as needed. 01/23/21   [provider]  lidocaine (LIDODERM) 5 % SMARTSIG:Patch(s) Topical 03/06/21   [provider]  MAGNESIUM PO Take by mouth.    [provider]  meclizine (MEDI-MECLIZINE) 25 MG tablet Take 1 tablet (25 mg total) by mouth 3 (three) times daily as needed for dizziness. 08/18/22   Menshew, Dannielle Karvonen, PA-C  meloxicam (MOBIC) 15 MG tablet Take by mouth. 09/14/19   [provider]  methocarbamol (ROBAXIN) 500 MG tablet Take 500 mg by mouth 2 (two) times daily as needed. 01/31/22   [provider]  mupirocin nasal ointment (BACTROBAN) 2 % Apply to wound 3 times a day. 09/20/20   Margarette Canada, NP  nystatin ointment (MYCOSTATIN) Apply topically.    [provider]  Olopatadine HCl 0.2 % SOLN Apply 1 drop to eye daily. 05/12/20   [provider]  ondansetron (ZOFRAN-ODT) 4 MG disintegrating tablet Take 1 tablet (4 mg total) by mouth every 8 (eight) hours as needed for nausea or vomiting. 08/18/22   Menshew, Dannielle Karvonen, PA-C  OZEMPIC, 0.25 OR 0.5 MG/DOSE, 2 MG/1.5ML SOPN Inject into the skin. 05/20/21   [provider]  polyethylene glycol powder (GLYCOLAX/MIRALAX) 17 GM/SCOOP powder Take by mouth.    [provider]  POTASSIUM PO Take by mouth.    [provider]  promethazine-dextromethorphan (PROMETHAZINE-DM) 6.25-15 MG/5ML syrup Take 5 mLs by mouth 4 (four) times daily as needed for cough. 10/28/22   Hans [place], NP  rizatriptan (MAXALT-MLT) 10 MG disintegrating tablet Give one tablet may repeat in 2 hours if needed.  No more than 4 in  1 week 04/14/18   [provider]  senna (SENOKOT) 8.6 MG tablet Take 2 tablets by mouth at bedtime.    [provider]  sertraline (ZOLOFT) 100 MG tablet Take 1 tablet by mouth daily. 03/20/21   [provider]  sertraline (ZOLOFT) 50 MG tablet PLEASE SEE ATTACHED FOR DETAILED DIRECTIONS 09/11/21   [provider]  solifenacin (VESICARE) 5 MG tablet Take 1 tablet by mouth daily. 01/14/22   [provider]  Spacer/Aero-Holding Chambers (AEROCHAMBER MV) inhaler Use as instructed 10/30/22   Margarette Canada, NP  spironolactone (ALDACTONE) 50 MG tablet Take 50 mg by mouth daily.    [provider]  valACYclovir (VALTREX) 1000 MG tablet Take 1 tablet (1,000 mg total) by mouth 3 (three) times daily. 05/09/22   Danton Clap, PA-C  amitriptyline (ELAVIL) 25 MG tablet Take  25 mg by mouth at bedtime as needed for sleep.  11/14/19  [provider]  lisinopril-hydrochlorothiazide (PRINZIDE,ZESTORETIC) 20-25 MG tablet Take 1 tablet by mouth daily.  02/02/21  [provider]  topiramate (TOPAMAX) 25 MG tablet Please start at '25mg'$  nightly for a week. You may increase to '25mg'$  twice a day a week later if you are tolerating this well. You can increase to three times per day a week later if you are still tolerating this well. 05/01/20 09/20/20  [provider]    Family History Family History  Problem Relation Age of Onset   Hypertension Mother    Hypertension Sister    Hypertension Brother     Social History Social History   Tobacco Use   Smoking status: Never   Smokeless tobacco: Never  Vaping Use   Vaping Use: Never used  Substance Use Topics   Alcohol use: No    Alcohol/week: 0.0 standard drinks of alcohol   Drug use: No     Allergies   Levofloxacin, Oxybutynin, Codeine, Iodinated contrast media, Other, Penicillins, and Tape   Review of Systems Review of Systems  Constitutional:  Negative for chills, fatigue and fever.   Gastrointestinal:  Negative for abdominal pain, diarrhea, nausea and vomiting.  Genitourinary:  Positive for dysuria, flank pain, frequency and urgency. Negative for decreased urine volume, hematuria, pelvic pain, vaginal bleeding, vaginal discharge and vaginal pain.  Musculoskeletal:  Positive for back pain.  Skin:  Negative for rash.     Physical Exam Triage Vital Signs ED Triage Vitals  Enc Vitals Group     BP      Pulse      Resp      Temp      Temp src      SpO2      Weight      Height      Head Circumference      Peak Flow      Pain Score      Pain Loc      Pain Edu?      Excl. in [place]?    No data found.  Updated Vital Signs BP (!) 171/90 (BP Location: Left Arm)   Pulse 76   Temp 98.2 F (36.8 C) (Oral)   Resp 14   Ht '4\' 11"'$  (1.499 m)   Wt 196 lb (88.9 kg)   LMP 10/25/2016   SpO2 95%   BMI 39.59 kg/m      Physical Exam Vitals and nursing note reviewed.  Constitutional:      General: She is not in acute distress.    Appearance: Normal appearance. She is well-developed. She is obese. She is not ill-appearing or toxic-appearing.  HENT:     Head: Normocephalic and atraumatic.  Eyes:     General: No scleral icterus.       Right eye: No discharge.        Left eye: No discharge.     Conjunctiva/sclera: Conjunctivae normal.  Cardiovascular:     Rate and Rhythm: Normal rate and regular rhythm.     Heart sounds: Normal heart sounds.  Pulmonary:     Effort: Pulmonary effort is normal. No respiratory distress.     Breath sounds: Normal breath sounds.  Abdominal:     Palpations: Abdomen is soft.     Tenderness: There is abdominal tenderness (suprapubic, LLQ, RLQ). There is left CVA tenderness. There is no right CVA tenderness.  Musculoskeletal:     Cervical  back: Neck supple.  Skin:    General: Skin is dry.  Neurological:     General: No focal deficit present.     Mental Status: She is alert. Mental status is at baseline.     Motor: No weakness.      Gait: Gait normal.  Psychiatric:        Mood and Affect: Mood normal.        Behavior: Behavior normal.        Thought Content: Thought content normal.      UC Treatments / Results  Labs (all labs ordered are listed, but only abnormal results are displayed) Labs Reviewed  WET PREP, GENITAL - Abnormal; Notable for the following components:      Result Value   Yeast Wet Prep HPF POC PRESENT (*)    Clue Cells Wet Prep HPF POC PRESENT (*)    WBC, Wet Prep HPF POC >10 (*)    All other components within normal limits  URINALYSIS, ROUTINE W REFLEX MICROSCOPIC - Abnormal; Notable for the following components:   Hgb urine dipstick TRACE (*)    Leukocytes,Ua TRACE (*)    All other components within normal limits  URINALYSIS, MICROSCOPIC (REFLEX) - Abnormal; Notable for the following components:   Bacteria, UA FEW (*)    All other components within normal limits  URINE CULTURE    EKG   Radiology No results found.  Procedures Procedures (including critical care time)  Medications Ordered in UC Medications - No data to display  Initial Impression / Assessment and Plan / UC Course  I have reviewed the triage vital signs and the nursing notes.  Pertinent labs & imaging results that were available during my care of the patient were reviewed by me and considered in my medical decision making (see chart for details).   47 year old female presents for dysuria, frequency and urgency x 2 days.  She was recently on cefdinir and azithromycin for suspected bacterial respiratory infections.  She is afebrile and overall well-appearing. She has subjective left CVA tenderness and tenderness throughout the lower abdomen.  No guarding or rebound.  Self obtained a wet prep shows evidence of bacterial vaginosis and yeast infection.  Will treat accordingly with metronidazole and Diflucan.  Urinalysis shows trace hemoglobin and leukocytes with bacteria on microscopic analysis.  Will send urine for  culture and treat for suspected UTI Bactrim DS.  Encourage plenty rest and fluids.  Reviewed return precautions.   Final Clinical Impressions(s) / UC Diagnoses   Final diagnoses:  Acute cystitis without hematuria  Urinary frequency  Dysuria  Acute vaginitis     Discharge Instructions      UTI: Based on either symptoms or urinalysis, you may have a urinary tract infection. We will send the urine for culture and call with results in a few days. Begin antibiotics at this time. Your symptoms should be much improved over the next 2-3 days. Increase rest and fluid intake. If for some reason symptoms are worsening or not improving after a couple of days or the urine culture determines the antibiotics you are taking will not treat the infection, the antibiotics may be changed. Return or go to ER for fever, back pain, worsening urinary pain, discharge, increased blood in urine. May take Tylenol or Motrin OTC for pain relief or consider AZO if no contraindications   The most common types of vaginal infections are yeast infections and bacterial vaginosis. Neither of which are really considered to be sexually transmitted. Often  a pH swab or wet prep is performed and if abnormal may reveal either type of infection. Begin metronidazole if prescribed for possible BV infection. If there is concern for yeast infection, fluconazole is often prescribed . Take this as directed. You may also apply topical miconazole (can be purchased OTC) externally for relief of itching. Increase rest and fluid intake. If labs sent out, we will call within 2-5 days with results and amend treatment if necessary. Always try to use pH balanced washes/wipes, urinate after intercourse, stay hydrated, and take probiotics if you are prone to vaginal infections. Return or see PCP or gynecologist for new/worsening infections.       ED Prescriptions     Medication Sig Dispense Auth. Provider   sulfamethoxazole-trimethoprim (BACTRIM DS)  800-160 MG tablet Take 1 tablet by mouth 2 (two) times daily for 7 days. 14 tablet Laurene Footman B, PA-C   metroNIDAZOLE (FLAGYL) 500 MG tablet Take 1 tablet (500 mg total) by mouth 2 (two) times daily for 7 days. 14 tablet Laurene Footman B, PA-C   fluconazole (DIFLUCAN) 150 MG tablet Take 1 tab p.o. every 72 hours for yeast infection 2 tablet Danton Clap, PA-C      PDMP not reviewed this encounter.   Danton Clap, PA-C 11/20/22 1356

## 2022-11-22 LAB — URINE CULTURE: Culture: 50000 — AB

## 2023-03-11 ENCOUNTER — Ambulatory Visit
Admission: EM | Admit: 2023-03-11 | Discharge: 2023-03-11 | Disposition: A | Discharge status: Home / Self Care | Payer: BC Managed Care – PPO | Attending: Physician Assistant | Admitting: Physician Assistant

## 2023-03-11 ENCOUNTER — Encounter: Payer: Self-pay | Admitting: Emergency Medicine

## 2023-03-11 DIAGNOSIS — B3741 Candidal cystitis and urethritis: Secondary | ICD-10-CM | POA: Diagnosis not present

## 2023-03-11 DIAGNOSIS — R300 Dysuria: Secondary | ICD-10-CM | POA: Diagnosis not present

## 2023-03-11 LAB — URINALYSIS, W/ REFLEX TO CULTURE (INFECTION SUSPECTED)
Bilirubin Urine: NEGATIVE
Glucose, UA: NEGATIVE mg/dL
Hgb urine dipstick: NEGATIVE
Ketones, ur: NEGATIVE mg/dL
Leukocytes,Ua: NEGATIVE
Nitrite: NEGATIVE
Protein, ur: NEGATIVE mg/dL
RBC / HPF: NONE SEEN RBC/hpf (ref 0–5)
Specific Gravity, Urine: 1.025 (ref 1.005–1.030)
pH: 5.5 (ref 5.0–8.0)

## 2023-03-11 MED ORDER — FLUCONAZOLE 150 MG PO TABS: 150.0000 mg | ORAL_TABLET | Freq: Every day | ORAL | 0 refills | Status: AC

## 2023-03-11 NOTE — ED Provider Notes (Signed)
[facility]    CSN: [id number] Arrival date & time: 03/11/23  1422      History   Chief Complaint Chief Complaint  Patient presents with   Back Pain   Dysuria    HPI Tiffany Andrade is a 47 y.o. female.    Patient presents for evaluation of bilateral low back pain, dysuria and lower abdominal pain and pressure that began this morning upon awakening.  Has attempted use of ibuprofen which has been ineffective.  History of reoccurring UTI.  Denies frequency, urgency, hematuria, vaginal symptoms.     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   Right leg pain 04/14/2018   Migraine without aura and without status migrainosus, not intractable 06/03/2017   Chronic cystitis 04/06/2017   Hematuria, microscopic 04/06/2017   Incomplete emptying of bladder 04/06/2017   Ureteral mass 04/06/2017   Unilateral ureteral reflux 04/06/2017   Right arm pain 11/12/2013   Anemia 09/25/2013   Constipation 09/25/2013   Dysmenorrhea 09/25/2013   Menorrhagia 09/25/2013   Hematochezia 05/08/2013   Obstructive sleep apnea 12/26/2012   Essential (primary) hypertension 10/19/2012   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      Home Medications    Prior to Admission medications   Medication Sig Start Date End Date Taking? Authorizing Provider  albuterol (VENTOLIN HFA) 108 (90 Base) MCG/ACT inhaler Inhale 2 puffs into the lungs every 4 (four) hours as needed. 10/30/22   Becky Augusta, NP  amLODipine (NORVASC) 5 MG tablet Take by mouth. 09/26/18   [provider]  B Complex-C-Calcium (GNP B-COMPLEX PLUS VITAMIN C) TABS Take by mouth.    [provider]  beclomethasone (QVAR REDIHALER) 40 MCG/ACT inhaler Inhale into the lungs. 01/18/21   [provider]  benzonatate (TESSALON) 100 MG capsule Take 1 capsule (100 mg total) by mouth every 8 (eight) hours. 10/28/22   Jacody Beneke, Elita Boone, NP  busPIRone (BUSPAR) 10 MG tablet Take 5 mg by mouth 2 (two) times daily. 11/09/19   [provider]  candesartan (ATACAND) 32 MG tablet Take by mouth. 12/26/20   [provider]  cetirizine (ZYRTEC) 10 MG tablet Take 10 mg by mouth daily. 03/19/21   [provider]  cyclobenzaprine (FLEXERIL) 10 MG tablet Take 10 mg by mouth at bedtime as needed. 12/23/19   [provider]  DULoxetine (CYMBALTA) 60 MG capsule Take 60 mg by mouth daily.    [provider]  fluconazole (DIFLUCAN) 150 MG tablet Take 1 tab p.o. every 72 hours for yeast infection 11/20/22   Shirlee Latch, PA-C  fluticasone (FLONASE) 50 MCG/ACT nasal spray Place 1 spray into both nostrils daily. 05/20/20   [provider]  Galcanezumab-gnlm 120 MG/ML SOAJ Inject into the skin. 12/29/21   [provider]  hydrOXYzine (VISTARIL) 25 MG capsule Take 25-50 mg by mouth 2 (two) times daily as needed. 01/23/21   [provider]  lidocaine (LIDODERM) 5 % SMARTSIG:Patch(s) Topical 03/06/21   [provider]  MAGNESIUM PO Take by mouth.    [provider]  meclizine (MEDI-MECLIZINE) 25 MG tablet Take 1 tablet (25 mg total) by mouth 3  (three) times daily as needed for dizziness. 08/18/22   Menshew, Charlesetta Ivory, PA-C  meloxicam (MOBIC) 15 MG tablet Take by mouth. 09/14/19   [provider]  methocarbamol (ROBAXIN) 500 MG tablet Take 500 mg by mouth 2 (two) times daily as needed. 01/31/22   [provider]  mupirocin nasal ointment (BACTROBAN) 2 % Apply to wound 3 times a day. 09/20/20   Becky Augusta, NP  nystatin ointment (MYCOSTATIN) Apply topically.    [provider]  Olopatadine HCl 0.2 % SOLN Apply 1 drop to eye daily. 05/12/20   [provider]  ondansetron (ZOFRAN-ODT) 4 MG disintegrating tablet Take 1 tablet (4 mg total) by mouth every 8 (eight) hours as needed for nausea or vomiting. 08/18/22   Menshew, Charlesetta Ivory, PA-C  OZEMPIC, 0.25 OR 0.5 MG/DOSE, 2 MG/1.5ML SOPN Inject into the skin. 05/20/21   [provider]  polyethylene glycol powder (GLYCOLAX/MIRALAX) 17 GM/SCOOP powder Take by mouth.    [provider]  POTASSIUM PO Take by mouth.    [provider]  promethazine-dextromethorphan (PROMETHAZINE-DM) 6.25-15 MG/5ML syrup Take 5 mLs by mouth 4 (four) times daily as needed for cough. 10/28/22   Valinda Hoar, NP  rizatriptan (MAXALT-MLT) 10 MG disintegrating tablet Give one tablet may repeat in 2 hours if needed.  No more than 4 in 1 week 04/14/18   [provider]  senna (SENOKOT) 8.6 MG tablet Take 2 tablets by mouth at bedtime.    [provider]  sertraline (ZOLOFT) 100 MG tablet Take 1 tablet by mouth daily. 03/20/21   [provider]  sertraline (ZOLOFT) 50 MG tablet PLEASE SEE ATTACHED FOR DETAILED DIRECTIONS 09/11/21   [provider]  solifenacin (VESICARE) 5 MG tablet Take 1 tablet by mouth daily. 01/14/22   [provider]  Spacer/Aero-Holding Chambers (AEROCHAMBER MV) inhaler Use as instructed 10/30/22   Becky [place]a, NP  spironolactone (ALDACTONE) 50 MG tablet Take 50 mg by mouth daily.     [provider]  valACYclovir (VALTREX) 1000 MG tablet Take 1 tablet (1,000 mg total) by mouth 3 (three) times daily. 05/09/22   Shirlee Latch, PA-C  amitriptyline (ELAVIL) 25 MG tablet Take 25 mg by mouth at bedtime as needed for sleep.  11/14/19  [provider]  lisinopril-hydrochlorothiazide (PRINZIDE,ZESTORETIC) 20-25 MG tablet Take 1 tablet by mouth daily.  02/02/21  [provider]  topiramate (TOPAMAX) 25 MG tablet Please start at 25mg  nightly for a week. You may increase to 25mg  twice a day a week later if you are tolerating this well. You can increase to three times per day a week later if you are still tolerating this well. 05/01/20 09/20/20  [provider]    Family History Family History  Problem Relation Age of Onset   Hypertension Mother    Hypertension Sister    Hypertension Brother     Social History  Social History   Tobacco Use   Smoking status: Never   Smokeless tobacco: Never  Vaping Use   Vaping Use: Never used  Substance Use Topics   Alcohol use: No    Alcohol/week: 0.0 standard drinks of alcohol   Drug use: No     Allergies   Levofloxacin, Oxybutynin, Codeine, Iodinated contrast media, Other, Penicillins, and Tape   Review of Systems Review of Systems  Genitourinary:  Positive for dysuria, flank pain and pelvic pain. Negative for decreased urine volume, difficulty urinating, dyspareunia, enuresis, frequency, genital sores, hematuria, menstrual problem, urgency, vaginal bleeding, vaginal discharge and vaginal pain.     Physical Exam Triage Vital Signs ED Triage Vitals  Enc Vitals Group     BP 03/11/23 1454 (!) 145/82     Pulse Rate 03/11/23 1454 83     Resp 03/11/23 1454 15     Temp 03/11/23 1454 97.7 F (36.5 C)     Temp Source 03/11/23 1454 Oral     SpO2 03/11/23 1454 95 %     Weight 03/11/23 1452 193 lb (87.5 kg)     Height 03/11/23 1452 4\' 11"  (1.499 m)     Head Circumference --      Peak Flow --       Pain Score 03/11/23 1451 10     Pain Loc --      Pain Edu? --      Excl. in [place]? --    No data found.  Updated Vital Signs BP (!) 145/82 (BP Location: Left Arm)   Pulse 83   Temp 97.7 F (36.5 C) (Oral)   Resp 15   Ht 4\' 11"  (1.499 m)   Wt 193 lb (87.5 kg)   LMP 10/25/2016   SpO2 95%   BMI 38.98 kg/m   Visual Acuity Right Eye Distance:   Left Eye Distance:   Bilateral Distance:    Right Eye Near:   Left Eye Near:    Bilateral Near:     Physical Exam Constitutional:      Appearance: Normal appearance.  Eyes:     Extraocular Movements: Extraocular movements intact.  Pulmonary:     Effort: Pulmonary effort is normal.  Neurological:     Mental Status: She is alert and oriented to person, place, and time. Mental status is at baseline.      UC Treatments / Results  Labs (all labs ordered are listed, but only abnormal results are displayed) Labs Reviewed  URINALYSIS, W/ REFLEX TO CULTURE (INFECTION SUSPECTED) - Abnormal; Notable for the following components:      Result Value   Bacteria, UA FEW (*)    All other components within normal limits    EKG   Radiology No results found.  Procedures Procedures (including critical care time)  Medications Ordered in UC Medications - No data to display  Initial Impression / Assessment and Plan / UC Course  I have reviewed the triage vital signs and the nursing notes.  Pertinent labs & imaging results that were available during my care of the patient were reviewed by me and considered in my medical decision making (see chart for details).  Clinical Course as of 03/11/23 1517  Fri Mar 11, 2023  1517 Budding Yeast: PRESENT [AW]    Clinical Course User Index [AW] Valinda Hoar, NP    yeast cystitis, dysuria  Urinalysis negative, does show budding yeast, Diflucan prescribed and discussed administration, due to history of reoccurring infections, urine sent to  culture, discussed additional supportive measures  with follow-up with urgent care as needed Final Clinical Impressions(s) / UC Diagnoses   Final diagnoses:  None   Discharge Instructions   None    ED Prescriptions   None    PDMP not reviewed this encounter.   Valinda Hoar, NP 03/11/23 1542

## 2023-03-11 NOTE — Discharge Instructions (Signed)
Your urinalysis does not show Shaquia Berkley blood cells or nitrates which are indicative of infection, your urine will be sent to the lab to determine exactly which bacteria is present, if any changes need to be made to your medications you will be notified  Urinalysis does show yeast  Take 1 diflucan tablet and then if still have symptoms in three days take 2nd tablet   Increase your fluid intake through use of water  As always practice good hygiene, wiping front to back and avoidance of scented vaginal products to prevent further irritation  If symptoms continue to persist after use of medication or recur please follow-up with urgent care or your primary doctor as needed

## 2023-03-11 NOTE — ED Triage Notes (Signed)
Patient c/o back pain and dysuria that started this morning. Patient reports chills.

## 2023-08-27 ENCOUNTER — Ambulatory Visit
Admission: EM | Admit: 2023-08-27 | Discharge: 2023-08-27 | Disposition: A | Discharge status: Home / Self Care | Payer: BC Managed Care – PPO | Attending: Physician Assistant | Admitting: Physician Assistant

## 2023-08-27 ENCOUNTER — Encounter: Payer: Self-pay | Admitting: Emergency Medicine

## 2023-08-27 DIAGNOSIS — N390 Urinary tract infection, site not specified: Secondary | ICD-10-CM | POA: Diagnosis present

## 2023-08-27 DIAGNOSIS — R109 Unspecified abdominal pain: Secondary | ICD-10-CM | POA: Insufficient documentation

## 2023-08-27 LAB — URINALYSIS, W/ REFLEX TO CULTURE (INFECTION SUSPECTED)
Bilirubin Urine: NEGATIVE
Glucose, UA: NEGATIVE mg/dL
Ketones, ur: NEGATIVE mg/dL
Nitrite: POSITIVE — AB
Protein, ur: >300 mg/dL — AB
RBC / HPF: >50 RBC/hpf (ref 0–5)
Specific Gravity, Urine: 1.025 (ref 1.005–1.030)
pH: 7 (ref 5.0–8.0)

## 2023-08-27 MED ORDER — SULFAMETHOXAZOLE-TRIMETHOPRIM 800-160 MG PO TABS: 1.0000 | ORAL_TABLET | Freq: Two times a day (BID) | ORAL | 0 refills | Status: AC

## 2023-08-27 NOTE — Discharge Instructions (Signed)

## 2023-08-27 NOTE — ED Provider Notes (Signed)
[facility]    CSN: [id number] Arrival date & time: 08/27/23  1122      History   Chief Complaint Chief Complaint  Patient presents with   Back Pain   Abdominal Pain    LLQ    HPI KONDA MORITA is a 47 y.o. female presenting for left lower abdominal pain, left flank pain and chills since last night. She denies fever, dysuria, frequency and urgency. No n/v/d or constipation. She says she believes she has a UTI.  Reports frequent UTIs and says she gets 6 to 7-year and the only antibiotic that works is Bactrim DS.  She is not reporting any vaginal discharge or itching.  No OTC meds taken.  No other concerns.  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   Right leg pain 04/14/2018   Migraine without aura and without status migrainosus, not intractable 06/03/2017   Chronic cystitis 04/06/2017   Hematuria, microscopic 04/06/2017   Incomplete emptying of bladder 04/06/2017   Ureteral mass 04/06/2017   Unilateral ureteral reflux 04/06/2017   Right arm pain 11/12/2013   Anemia 09/25/2013   Constipation 09/25/2013   Dysmenorrhea 09/25/2013   Menorrhagia 09/25/2013   Hematochezia 05/08/2013   Obstructive sleep apnea 12/26/2012   Essential (primary) hypertension 10/19/2012   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      Home Medications    Prior to Admission medications   Medication Sig Start Date End Date Taking? Authorizing Provider  candesartan (ATACAND) 32 MG tablet Take by mouth. 12/26/20  Yes [provider]  DULoxetine (CYMBALTA) 60 MG capsule Take 60 mg by mouth daily.   Yes [provider]  Galcanezumab-gnlm 120 MG/ML SOSY Inject into the skin. 07/13/23  Yes [provider]  mometasone-formoterol (DULERA) 100-5 MCG/ACT AERO INHALE 2 PUFFS AS DIRECTED TWICE A DAY 11/15/22  Yes [provider]  spironolactone (ALDACTONE) 50 MG tablet Take 50 mg by mouth daily.   Yes [provider]  sulfamethoxazole-trimethoprim (BACTRIM DS) 800-160 MG tablet Take 1 tablet by mouth 2 (two) times daily for 7 days. 08/27/23 09/03/23 Yes Eusebio Friendly B, PA-C  traMADol (ULTRAM) 50 MG tablet Take by mouth. 06/06/23  Yes [provider]  albuterol (VENTOLIN HFA) 108 (90 Base) MCG/ACT inhaler Inhale 2 puffs into the lungs every 4 (four) hours as needed. 10/30/22   Becky Augusta, NP  amLODipine (NORVASC) 5 MG tablet Take by mouth. 09/26/18   [provider]  B Complex-C-Calcium (GNP B-COMPLEX PLUS VITAMIN C) TABS Take by mouth.  [provider]  beclomethasone (QVAR REDIHALER) 40 MCG/ACT inhaler Inhale into the lungs. 01/18/21   [provider]  benzonatate (TESSALON) 100 MG capsule Take 1 capsule (100 mg total) by mouth every 8 (eight) hours. 10/28/22   White, Elita Boone, NP  busPIRone (BUSPAR) 10 MG tablet Take 5 mg by mouth 2 (two) times daily. 11/09/19   [provider]  cetirizine (ZYRTEC) 10 MG tablet Take 10 mg by mouth daily. 03/19/21   [provider]  chlorthalidone (HYGROTON) 25 MG tablet Take 25 mg by mouth daily.    [provider]   cyclobenzaprine (FLEXERIL) 10 MG tablet Take 10 mg by mouth at bedtime as needed. 12/23/19   [provider]  fluticasone (FLONASE) 50 MCG/ACT nasal spray Place 1 spray into both nostrils daily. 05/20/20   [provider]  Galcanezumab-gnlm 120 MG/ML SOAJ Inject into the skin. 12/29/21   [provider]  hydrOXYzine (VISTARIL) 25 MG capsule Take 25-50 mg by mouth 2 (two) times daily as needed. 01/23/21   [provider]  lidocaine (LIDODERM) 5 % SMARTSIG:Patch(s) Topical 03/06/21   [provider]  MAGNESIUM PO Take by mouth.    [provider]  meclizine (MEDI-MECLIZINE) 25 MG tablet Take 1 tablet (25 mg total) by mouth 3 (three) times daily as needed for dizziness. 08/18/22   Menshew, Charlesetta Ivory, PA-C  meloxicam (MOBIC) 15 MG tablet Take by mouth. 09/14/19   [provider]  methocarbamol (ROBAXIN) 500 MG tablet Take 500 mg by mouth 2 (two) times daily as needed. 01/31/22   [provider]  mupirocin nasal ointment (BACTROBAN) 2 % Apply to wound 3 times a day. 09/20/20   Becky Augusta, NP  nystatin ointment (MYCOSTATIN) Apply topically.    [provider]  Olopatadine HCl 0.2 % SOLN Apply 1 drop to eye daily. 05/12/20   [provider]  ondansetron (ZOFRAN-ODT) 4 MG disintegrating tablet Take 1 tablet (4 mg total) by mouth every 8 (eight) hours as needed for nausea or vomiting. 08/18/22   Menshew, Charlesetta Ivory, PA-C  OZEMPIC, 0.25 OR 0.5 MG/DOSE, 2 MG/1.5ML SOPN Inject into the skin. 05/20/21   [provider]  polyethylene glycol powder (GLYCOLAX/MIRALAX) 17 GM/SCOOP powder Take by mouth.    [provider]  POTASSIUM PO Take by mouth.    [provider]  promethazine-dextromethorphan (PROMETHAZINE-DM) 6.25-15 MG/5ML syrup Take 5 mLs by mouth 4 (four) times daily as needed for cough. 10/28/22   Valinda Hoar, NP  rizatriptan (MAXALT-MLT) 10 MG disintegrating tablet Give one tablet  may repeat in 2 hours if needed.  No more than 4 in 1 week 04/14/18   [provider]  senna (SENOKOT) 8.6 MG tablet Take 2 tablets by mouth at bedtime.    [provider]  sertraline (ZOLOFT) 100 MG tablet Take 1 tablet by mouth daily. 03/20/21   [provider]  sertraline (ZOLOFT) 50 MG tablet PLEASE SEE ATTACHED FOR DETAILED DIRECTIONS 09/11/21   [provider]  solifenacin (VESICARE) 5 MG tablet Take 1 tablet by mouth daily. 01/14/22   [provider]  Spacer/Aero-Holding Chambers (AEROCHAMBER MV) inhaler Use as instructed 10/30/22   Becky Augusta, NP  tiZANidine (ZANAFLEX) 4 MG tablet Take 2-4 mg by mouth 2 (two) times daily as needed.    [provider]  valACYclovir (VALTREX) 1000 MG tablet Take 1 tablet (1,000 mg total) by mouth 3 (three) times daily. 05/09/22   Shirlee Latch, PA-C  amitriptyline (ELAVIL) 25 MG tablet Take 25 mg by mouth at bedtime as needed for sleep.  11/14/19  [provider]  lisinopril-hydrochlorothiazide (PRINZIDE,ZESTORETIC) 20-25 MG tablet Take 1 tablet by mouth daily.  02/02/21  [provider]  topiramate (TOPAMAX) 25 MG tablet Please start at 25mg  nightly for a week. You may increase to 25mg  twice a day a week later if you are tolerating this well. You can increase to three times per day a week later if you are still tolerating this well. 05/01/20 09/20/20  [provider]    Family History Family History  Problem Relation Age of Onset   Hypertension Mother    Hypertension Sister    Hypertension Brother     Social History Social History   Tobacco Use   Smoking status: Never   Smokeless tobacco: Never  Vaping Use   Vaping status: Never Used  Substance Use Topics   Alcohol use: No    Alcohol/week: 0.0 standard drinks of alcohol   Drug use: No     Allergies   Levofloxacin, Oxybutynin, Codeine, Iodinated contrast media, Other, Penicillins, and Tape   Review of  Systems Review of Systems  Constitutional:  Positive for chills and fatigue. Negative for fever.  Gastrointestinal:  Positive for abdominal pain. Negative for diarrhea, nausea and vomiting.  Genitourinary:  Positive for flank pain. Negative for decreased urine volume, dysuria, frequency, hematuria, pelvic pain, urgency, vaginal bleeding, vaginal discharge and vaginal pain.  Musculoskeletal:  Positive for back pain.  Skin:  Negative for rash.     Physical Exam Triage Vital Signs ED Triage Vitals  Enc Vitals Group     BP      Pulse      Resp      Temp      Temp src      SpO2      Weight      Height      Head Circumference      Peak Flow      Pain Score      Pain Loc      Pain Edu?      Excl. in GC?    No data found.  Updated Vital Signs BP (!) 133/97 (BP Location: Left Arm)   Pulse 87   Temp 98.3 F (36.8 C) (Oral)   Resp 14   Ht 4\' 11"  (1.499 m)   Wt 198 lb (89.8 kg)   LMP 10/25/2016   SpO2 95%   BMI 39.99 kg/m      Physical Exam Vitals and nursing note reviewed.  Constitutional:      General: She is not in acute distress.    Appearance: Normal appearance. She is well-developed. She is obese. She is not ill-appearing or toxic-appearing.  HENT:     Head: Normocephalic and atraumatic.  Eyes:     General: No scleral icterus.       Right eye: No discharge.        Left eye: No discharge.     Conjunctiva/sclera: Conjunctivae normal.  Cardiovascular:     Rate and Rhythm: Normal rate and regular rhythm.     Heart sounds: Normal heart sounds.  Pulmonary:     Effort: Pulmonary effort is normal. No respiratory distress.     Breath sounds: Normal breath sounds.  Abdominal:     Palpations: Abdomen is soft.     Tenderness: There is abdominal tenderness (suprapubic, LLQ). There is left CVA tenderness. There is no right CVA  tenderness.  Musculoskeletal:     Cervical back: Neck supple.  Skin:    General: Skin is dry.  Neurological:     General: No focal deficit  present.     Mental Status: She is alert. Mental status is at baseline.     Motor: No weakness.     Gait: Gait normal.  Psychiatric:        Mood and Affect: Mood normal.        Behavior: Behavior normal.        Thought Content: Thought content normal.      UC Treatments / Results  Labs (all labs ordered are listed, but only abnormal results are displayed) Labs Reviewed  URINALYSIS, W/ REFLEX TO CULTURE (INFECTION SUSPECTED) - Abnormal; Notable for the following components:      Result Value   APPearance CLOUDY (*)    Hgb urine dipstick LARGE (*)    Protein, ur >300 (*)    Nitrite POSITIVE (*)    Leukocytes,Ua SMALL (*)    Bacteria, UA FEW (*)    All other components within normal limits  URINE CULTURE    EKG   Radiology No results found.  Procedures Procedures (including critical care time)  Medications Ordered in UC Medications - No data to display  Initial Impression / Assessment and Plan / UC Course  I have reviewed the triage vital signs and the nursing notes.  Pertinent labs & imaging results that were available during my care of the patient were reviewed by me and considered in my medical decision making (see chart for details).   47 year old female presents for left lower abdominal pain, left flank pain, chills and fatigue since yesterday. Denies fever, dysuria, hematuria.  History of frequent UTIs.  She is afebrile and overall well-appearing. She has subjective left CVA tenderness and tenderness throughout the lower abdomen.  No guarding or rebound.  Urinalysis shows large hemoglobin and leukocytes, nitrites, and cloudiness with bacteria on microscopic analysis.  Will send urine for culture and treat for suspected UTI Bactrim DS.  Encourage plenty rest and fluids.  Reviewed return precautions.   Final Clinical Impressions(s) / UC Diagnoses   Final diagnoses:  Urinary tract infection without hematuria, site unspecified  Left flank pain     Discharge  Instructions      UTI: Based on either symptoms or urinalysis, you may have a urinary tract infection. We will send the urine for culture and call with results in a few days. Begin antibiotics at this time. Your symptoms should be much improved over the next 2-3 days. Increase rest and fluid intake. If for some reason symptoms are worsening or not improving after a couple of days or the urine culture determines the antibiotics you are taking will not treat the infection, the antibiotics may be changed. Return or go to ER for fever, back pain, worsening urinary pain, discharge, increased blood in urine. May take Tylenol or Motrin OTC for pain relief or consider AZO if no contraindications       ED Prescriptions     Medication Sig Dispense Auth. Provider   sulfamethoxazole-trimethoprim (BACTRIM DS) 800-160 MG tablet Take 1 tablet by mouth 2 (two) times daily for 7 days. 14 tablet Gareth Morgan      PDMP not reviewed this encounter.      Shirlee Latch, PA-C 08/27/23 1253

## 2023-08-27 NOTE — ED Triage Notes (Signed)
Patient c/o left lower back pain that radiates to her LLQ of her abdomen that started last night.  Patient denies injury or fall.  Patient denies N/V/D.  Patient reports chills.  Patient denies any cold symptoms.

## 2023-08-28 LAB — URINE CULTURE: Culture: NO GROWTH

## 2023-09-21 ENCOUNTER — Encounter: Payer: Self-pay | Admitting: Emergency Medicine

## 2023-09-21 ENCOUNTER — Ambulatory Visit
Admission: EM | Admit: 2023-09-21 | Discharge: 2023-09-21 | Disposition: A | Discharge status: Home / Self Care | Payer: BC Managed Care – PPO

## 2023-09-21 DIAGNOSIS — S76212A Strain of adductor muscle, fascia and tendon of left thigh, initial encounter: Secondary | ICD-10-CM

## 2023-09-21 NOTE — Discharge Instructions (Signed)
Rest is much as possible and avoid activities that cause pain in your muscles.  You may apply ice or cold compresses to the area for 15 to 20 minutes a day every 4-6 hours.  You may use a compression device, such as a sleeve or shorts to help provide support to the muscles and aid in swelling and pain relief.  Elevate your injured extremity is much as possible to help decrease swelling and aid in pain relief.  Use over-the-counter NSAID's, such as ibuprofen or Aleve, according to the package instructions.  Take them with food.  Follow the home physical therapy exercises provided at discharge.  If your symptoms continue, or they worsen, I recommend you follow-up with orthopedics.

## 2023-09-21 NOTE — ED Triage Notes (Signed)
Pt was at work last night and sneezed tensing up her muscles. She now has left side groin pain that radiates down her leg.

## 2023-09-21 NOTE — ED Provider Notes (Signed)
[facility]    CSN: [id number] Arrival date & time: 09/21/23  6578      History   Chief Complaint Chief Complaint  Patient presents with   Groin Pain    HPI MAYBRIE BRISBON is a 47 y.o. female.   HPI  55-year-old female with a past medical history significant for PTSD, UTI, hypertension, anxiety, depression, and DVT presents for evaluation of pain in her left groin.  She reports that last night while she was at work she sneezed and felt a pull in her left groin.  She is not experiencing pain in her left groin that radiates down her leg into the arch of her foot whenever she walks.  She took 800 mg of ibuprofen last night and the tizanidine at 11 PM when she got home.  She denies any swelling or bruising.  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   Right leg pain 04/14/2018   Migraine without aura and without status migrainosus, not intractable 06/03/2017   Chronic cystitis 04/06/2017   Hematuria, microscopic 04/06/2017   Incomplete emptying of bladder 04/06/2017   Ureteral mass 04/06/2017   Unilateral ureteral reflux 04/06/2017   Right arm pain 11/12/2013   Anemia 09/25/2013   Constipation 09/25/2013   Dysmenorrhea 09/25/2013   Menorrhagia 09/25/2013   Hematochezia 05/08/2013   Obstructive sleep apnea 12/26/2012   Essential (primary) hypertension 10/19/2012   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      Home Medications    Prior to Admission medications   Medication Sig Start Date End Date Taking? Authorizing Provider  albuterol (VENTOLIN HFA) 108 (90 Base) MCG/ACT inhaler Inhale 2 puffs into the lungs every 4 (four) hours as needed. 10/30/22   Becky Augusta, NP  amLODipine (NORVASC) 5 MG tablet Take by mouth. 09/26/18   [provider]  B Complex-C-Calcium (GNP B-COMPLEX PLUS VITAMIN C) TABS Take by mouth.    [provider]  beclomethasone (QVAR REDIHALER) 40 MCG/ACT inhaler Inhale into the lungs. 01/18/21   [provider]  benzonatate (TESSALON) 100 MG capsule Take 1 capsule (100 mg total) by mouth every 8 (eight) hours. 10/28/22   White, Elita Boone, NP  busPIRone (BUSPAR) 10 MG tablet Take 5 mg by mouth 2 (two) times daily. 11/09/19   [provider]  candesartan (ATACAND) 32 MG tablet Take by mouth. 12/26/20   [provider]  cetirizine (ZYRTEC) 10 MG tablet Take 10 mg by mouth daily. 03/19/21   [provider]  chlorthalidone (HYGROTON) 25 MG tablet Take 25 mg  by mouth daily.    [provider]  cyclobenzaprine (FLEXERIL) 10 MG tablet Take 10 mg by mouth at bedtime as needed. 12/23/19   [provider]  DULoxetine (CYMBALTA) 60 MG capsule Take 60 mg by mouth daily.    [provider]  fluticasone (FLONASE) 50 MCG/ACT nasal spray Place 1 spray into both nostrils daily. 05/20/20   [provider]  Galcanezumab-gnlm 120 MG/ML SOAJ Inject into the skin. 12/29/21   [provider]  Galcanezumab-gnlm 120 MG/ML SOSY Inject into the skin. 07/13/23   [provider]  hydrOXYzine (VISTARIL) 25 MG capsule Take 25-50 mg by mouth 2  (two) times daily as needed. 01/23/21   [provider]  lidocaine (LIDODERM) 5 % SMARTSIG:Patch(s) Topical 03/06/21   [provider]  MAGNESIUM PO Take by mouth.    [provider]  meclizine (MEDI-MECLIZINE) 25 MG tablet Take 1 tablet (25 mg total) by mouth 3 (three) times daily as needed for dizziness. 08/18/22   Menshew, Charlesetta Ivory, PA-C  meloxicam (MOBIC) 15 MG tablet Take by mouth. 09/14/19   [provider]  methocarbamol (ROBAXIN) 500 MG tablet Take 500 mg by mouth 2 (two) times daily as needed. 01/31/22   [provider]  mometasone-formoterol (DULERA) 100-5 MCG/ACT AERO INHALE 2 PUFFS AS DIRECTED TWICE A DAY 11/15/22   [provider]  mupirocin nasal ointment (BACTROBAN) 2 % Apply to wound 3 times a day. 09/20/20   Becky Augusta, NP  nystatin ointment (MYCOSTATIN) Apply topically.    [provider]  Olopatadine HCl 0.2 % SOLN Apply 1 drop to eye daily. 05/12/20   [provider]  ondansetron (ZOFRAN-ODT) 4 MG disintegrating tablet Take 1 tablet (4 mg total) by mouth every 8 (eight) hours as needed for nausea or vomiting. 08/18/22   Menshew, Charlesetta Ivory, PA-C  OZEMPIC, 0.25 OR 0.5 MG/DOSE, 2 MG/1.5ML SOPN Inject into the skin. 05/20/21   [provider]  polyethylene glycol powder (GLYCOLAX/MIRALAX) 17 GM/SCOOP powder Take by mouth.    [provider]  POTASSIUM PO Take by mouth.    [provider]  promethazine-dextromethorphan (PROMETHAZINE-DM) 6.25-15 MG/5ML syrup Take 5 mLs by mouth 4 (four) times daily as needed for cough. 10/28/22   Valinda Hoar, NP  rizatriptan (MAXALT-MLT) 10 MG disintegrating tablet Give one tablet may repeat in 2 hours if needed.  No more than 4 in 1 week 04/14/18   [provider]  senna (SENOKOT) 8.6 MG tablet Take 2 tablets by mouth at bedtime.    [provider]  sertraline (ZOLOFT) 100 MG tablet Take 1 tablet by mouth daily. 03/20/21    [provider]  sertraline (ZOLOFT) 50 MG tablet PLEASE SEE ATTACHED FOR DETAILED DIRECTIONS 09/11/21   [provider]  solifenacin (VESICARE) 5 MG tablet Take 1 tablet by mouth daily. 01/14/22   [provider]  Spacer/Aero-Holding Chambers (AEROCHAMBER MV) inhaler Use as instructed 10/30/22   Becky Augusta, NP  spironolactone (ALDACTONE) 50 MG tablet Take 50 mg by mouth daily.    [provider]  tiZANidine (ZANAFLEX) 4 MG tablet Take 2-4 mg by mouth 2 (two) times daily as needed.    [provider]  traMADol (ULTRAM) 50 MG tablet Take by mouth. 06/06/23   [provider]  valACYclovir (VALTREX) 1000 MG tablet Take 1 tablet (1,000 mg total) by mouth 3 (three) times daily. 05/09/22   Shirlee Latch, PA-C  amitriptyline (ELAVIL) 25 MG tablet Take  25 mg by mouth at bedtime as needed for sleep.  11/14/19  [provider]  lisinopril-hydrochlorothiazide (PRINZIDE,ZESTORETIC) 20-25 MG tablet Take 1 tablet by mouth daily.  02/02/21  [provider]  topiramate (TOPAMAX) 25 MG tablet Please start at 25mg  nightly for a week. You may increase to 25mg  twice a day a week later if you are tolerating this well. You can increase to three times per day a week later if you are still tolerating this well. 05/01/20 09/20/20  [provider]    Family History Family History  Problem Relation Age of Onset   Hypertension Mother    Hypertension Sister    Hypertension Brother     Social History Social History   Tobacco Use   Smoking status: Never   Smokeless tobacco: Never  Vaping Use   Vaping status: Never Used  Substance Use Topics   Alcohol use: No    Alcohol/week: 0.0 standard drinks of alcohol   Drug use: No     Allergies   Levofloxacin, Oxybutynin, Codeine, Iodinated contrast media, Other, Penicillins, Sulfa antibiotics, and Tape   Review of Systems Review of Systems  Musculoskeletal:  Positive for myalgias.      Physical Exam Triage Vital Signs ED Triage Vitals  Encounter Vitals Group     BP      Systolic BP Percentile      Diastolic BP Percentile      Pulse      Resp      Temp      Temp src      SpO2      Weight      Height      Head Circumference      Peak Flow      Pain Score      Pain Loc      Pain Education      Exclude from Growth Chart    No data found.  Updated Vital Signs BP 107/80 (BP Location: Left Arm)   Pulse 88   Temp 98.1 F (36.7 C) (Oral)   Resp 16   LMP 10/25/2016   SpO2 (!) 65%   Visual Acuity Right Eye Distance:   Left Eye Distance:   Bilateral Distance:    Right Eye Near:   Left Eye Near:    Bilateral Near:     Physical Exam Vitals and nursing note reviewed.  Constitutional:      Appearance: Normal appearance.  HENT:     Head: Normocephalic and atraumatic.  Musculoskeletal:        General: Tenderness and signs of injury present. No swelling.  Skin:    General: Skin is warm and dry.     Capillary Refill: Capillary refill takes less than 2 seconds.  Neurological:     General: No focal deficit present.     Mental Status: She is alert and oriented to person, place, and time.      UC Treatments / Results  Labs (all labs ordered are listed, but only abnormal results are displayed) Labs Reviewed - No data to display  EKG   Radiology No results found.  Procedures Procedures (including critical care time)  Medications Ordered in UC Medications - No data to display  Initial Impression / Assessment and Plan / UC Course  I have reviewed the triage vital signs and the nursing notes.  Pertinent labs & imaging results that were available during my care of the patient were reviewed by me and considered in  my medical decision making (see chart for details).   Patient is a pleasant, nontoxic-appearing 47 year old female presenting for evaluation of pain in her left groin that started last night while at work after sneezing.  On exam  she is walking with a bit of a limp and she does favor her left leg when getting up onto the exam table.  On exam she does have pain with palpation of the medial aspect of the quadriceps complex but not with the central or lateral aspect.  No pain with passive range of motion while doing flexion extension as well as external rotation.  Mild increase in pain with internal rotation.  Patient's exam is consistent with a groin strain I will discharge her home with a diagnosis of such.  She should continue using ibuprofen and she may also continue using her tizanidine as needed for pain and muscle spasm.  We discussed home physical therapy, ice application, and wearing compression shorts to provide some support to the muscle and aid in pain relief.  If her symptoms do not improve she has to follow-up with orthopedics.   Final Clinical Impressions(s) / UC Diagnoses   Final diagnoses:  Inguinal strain, left, initial encounter     Discharge Instructions      Rest is much as possible and avoid activities that cause pain in your muscles.  You may apply ice or cold compresses to the area for 15 to 20 minutes a day every 4-6 hours.  You may use a compression device, such as a sleeve or shorts to help provide support to the muscles and aid in swelling and pain relief.  Elevate your injured extremity is much as possible to help decrease swelling and aid in pain relief.  Use over-the-counter NSAID's, such as ibuprofen or Aleve, according to the package instructions.  Take them with food.  Follow the home physical therapy exercises provided at discharge.  If your symptoms continue, or they worsen, I recommend you follow-up with orthopedics.      ED Prescriptions   None    I have reviewed the PDMP during this encounter.   Becky Augusta, NP 09/21/23 1018

## 2023-11-18 ENCOUNTER — Ambulatory Visit
Admission: EM | Admit: 2023-11-18 | Discharge: 2023-11-18 | Disposition: A | Discharge status: Home / Self Care | Payer: BC Managed Care – PPO

## 2023-11-18 DIAGNOSIS — R197 Diarrhea, unspecified: Secondary | ICD-10-CM

## 2023-11-18 DIAGNOSIS — R1031 Right lower quadrant pain: Secondary | ICD-10-CM

## 2023-11-18 NOTE — ED Triage Notes (Signed)
Patient states that she's had severe abdominal pain and diarrhea for 4 days RLQ pain

## 2023-11-18 NOTE — ED Provider Notes (Signed)
[facility]    CSN: [id number] Arrival date & time: 11/18/23  1223      History   Chief Complaint Chief Complaint  Patient presents with   Abdominal Pain   Diarrhea    HPI Tiffany Andrade is a 48 y.o. female.   48 year old female pt, Tiffany Andrade, presents to urgent care for evaluation of diarrhea x 4 days.  Patient reports 6 out of 10 right lower quadrant pain, no rebound no fevers, unknown illness exposure.  Patient is eating well drinking well voiding well per her report.  The history is provided by the patient. No language interpreter was used.    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   Right leg pain 04/14/2018   Migraine without aura and without status migrainosus, not intractable 06/03/2017   Chronic cystitis 04/06/2017   Hematuria, microscopic 04/06/2017   Incomplete emptying of bladder 04/06/2017   Ureteral mass 04/06/2017   Unilateral ureteral reflux 04/06/2017   Right arm pain 11/12/2013   Anemia 09/25/2013   Constipation 09/25/2013   Dysmenorrhea 09/25/2013   Menorrhagia 09/25/2013   Hematochezia 05/08/2013   Obstructive sleep apnea 12/26/2012   Essential (primary) hypertension 10/19/2012   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      Home Medications    Prior to Admission medications   Medication Sig Start Date End Date Taking? Authorizing Provider  beclomethasone (QVAR REDIHALER) 40 MCG/ACT inhaler Inhale into the lungs. 01/18/21  Yes [provider]  candesartan (ATACAND) 32 MG tablet Take by mouth. 12/26/20  Yes [provider]  chlorthalidone (HYGROTON) 25 MG tablet Take 25 mg by mouth daily.   Yes [provider]  Galcanezumab-gnlm 120 MG/ML SOAJ Inject into the skin. 12/29/21  Yes [provider]  hydrOXYzine (VISTARIL) 25 MG capsule Take 25-50 mg by mouth 2 (two) times daily as needed. 01/23/21  Yes [provider]  MAGNESIUM PO Take by mouth.   Yes [provider]  mometasone-formoterol (DULERA) 100-5 MCG/ACT AERO INHALE 2 PUFFS AS DIRECTED TWICE A DAY 11/15/22  Yes [provider]  nystatin ointment (MYCOSTATIN) Apply topically.   Yes [provider]  Olopatadine HCl 0.2 % SOLN Apply 1 drop to eye daily. 05/12/20  Yes [provider]  POTASSIUM PO Take by mouth.   Yes [provider]  rizatriptan (MAXALT-MLT) 10 MG disintegrating tablet Give one tablet may repeat in 2 hours if needed.  No more than 4 in 1 week  04/14/18  Yes [provider]  Spacer/Aero-Holding Chambers (AEROCHAMBER MV) inhaler Use as instructed 10/30/22  Yes Becky Augusta, NP  spironolactone (ALDACTONE) 50 MG tablet Take 50 mg by mouth daily.   Yes [provider]  tiZANidine (ZANAFLEX) 4 MG tablet Take 2-4 mg by mouth 2 (two) times daily as needed.   Yes [provider]  topiramate (TOPAMAX) 25 MG tablet Take 25 mg by mouth daily. 10/07/23  Yes [provider]  traMADol (ULTRAM) 50 MG tablet Take by mouth. 06/06/23  Yes [provider]  albuterol  (VENTOLIN HFA) 108 (90 Base) MCG/ACT inhaler Inhale 2 puffs into the lungs every 4 (four) hours as needed. 10/30/22   Becky Augusta, NP  amLODipine (NORVASC) 5 MG tablet Take by mouth. 09/26/18   [provider]  B Complex-C-Calcium (GNP B-COMPLEX PLUS VITAMIN C) TABS Take by mouth.    [provider]  benzonatate (TESSALON) 100 MG capsule Take 1 capsule (100 mg total) by mouth every 8 (eight) hours. 10/28/22   White, Elita Boone, NP  busPIRone (BUSPAR) 10 MG tablet Take 5 mg by mouth 2 (two) times daily. 11/09/19   [provider]  cetirizine (ZYRTEC) 10 MG tablet Take 10 mg by mouth daily. 03/19/21   [provider]  cyclobenzaprine (FLEXERIL) 10 MG tablet Take 10 mg by mouth at bedtime as needed. 12/23/19   [provider]  DULoxetine (CYMBALTA) 60 MG capsule Take 60 mg by mouth daily.    [provider]  fluticasone (FLONASE) 50 MCG/ACT nasal spray Place 1 spray into both nostrils daily. 05/20/20   [provider]  Galcanezumab-gnlm 120 MG/ML SOSY Inject into the skin. 07/13/23   [provider]  lidocaine (LIDODERM) 5 % SMARTSIG:Patch(s) Topical 03/06/21   [provider]  meclizine (MEDI-MECLIZINE) 25 MG tablet Take 1 tablet (25 mg total) by mouth 3 (three) times daily as needed for dizziness. 08/18/22   Menshew, Charlesetta Ivory, PA-C  meloxicam (MOBIC) 15 MG tablet Take by mouth. 09/14/19   [provider]  methocarbamol (ROBAXIN) 500 MG tablet Take 500 mg by mouth 2 (two) times daily as needed. 01/31/22   [provider]  mupirocin nasal ointment (BACTROBAN) 2 % Apply to wound 3 times a day. 09/20/20   Becky Augusta, NP  ondansetron (ZOFRAN-ODT) 4 MG disintegrating tablet Take 1 tablet (4 mg total) by mouth every 8 (eight) hours as needed for nausea or vomiting. 08/18/22   Menshew, Charlesetta Ivory, PA-C  OZEMPIC, 0.25 OR 0.5 MG/DOSE, 2 MG/1.5ML SOPN Inject into the skin. 05/20/21   [provider]   polyethylene glycol powder (GLYCOLAX/MIRALAX) 17 GM/SCOOP powder Take by mouth.    [provider]  promethazine-dextromethorphan (PROMETHAZINE-DM) 6.25-15 MG/5ML syrup Take 5 mLs by mouth 4 (four) times daily as needed for cough. 10/28/22   Valinda Hoar, NP  senna (SENOKOT) 8.6 MG tablet Take 2 tablets by mouth at bedtime.    [provider]  sertraline (ZOLOFT) 100 MG tablet Take 1 tablet by mouth daily. 03/20/21   [provider]  sertraline (ZOLOFT) 50 MG tablet PLEASE SEE ATTACHED FOR DETAILED DIRECTIONS 09/11/21   [provider]  solifenacin (VESICARE) 5 MG tablet Take 1 tablet by mouth daily. 01/14/22   [provider]  valACYclovir (VALTREX) 1000 MG tablet Take 1 tablet (1,000 mg total) by mouth 3 (three) times daily. 05/09/22   Shirlee Latch, PA-C  amitriptyline (ELAVIL) 25 MG tablet Take 25 mg by mouth  at bedtime as needed for sleep.  11/14/19  [provider]  lisinopril-hydrochlorothiazide (PRINZIDE,ZESTORETIC) 20-25 MG tablet Take 1 tablet by mouth daily.  02/02/21  [provider]    Family History Family History  Problem Relation Age of Onset   Hypertension Mother    Hypertension Sister    Hypertension Brother     Social History Social History   Tobacco Use   Smoking status: Never   Smokeless tobacco: Never  Vaping Use   Vaping status: Never Used  Substance Use Topics   Alcohol use: No    Alcohol/week: 0.0 standard drinks of alcohol   Drug use: No     Allergies   Levofloxacin, Oxybutynin, Codeine, Iodinated contrast media, Other, Penicillins, Buprenorphine, Sulfa antibiotics, and Tape   Review of Systems Review of Systems  Constitutional:  Negative for fever.  Gastrointestinal:  Positive for abdominal pain and diarrhea. Negative for nausea and vomiting.  All other systems reviewed and are negative.    Physical Exam Triage Vital Signs ED Triage Vitals  Encounter Vitals Group     BP       Systolic BP Percentile      Diastolic BP Percentile      Pulse      Resp      Temp      Temp src      SpO2      Weight      Height      Head Circumference      Peak Flow      Pain Score      Pain Loc      Pain Education      Exclude from Growth Chart    No data found.  Updated Vital Signs BP (!) 115/98 (BP Location: Right Arm)   Pulse 72   Temp 98.2 F (36.8 C) (Oral)   Resp 19   LMP 10/25/2016   SpO2 99%   Visual Acuity Right Eye Distance:   Left Eye Distance:   Bilateral Distance:    Right Eye Near:   Left Eye Near:    Bilateral Near:     Physical Exam Vitals and nursing note reviewed.  Constitutional:      General: She is not in acute distress.    Appearance: She is well-developed.  HENT:     Head: Normocephalic and atraumatic.  Eyes:     Conjunctiva/sclera: Conjunctivae normal.  Cardiovascular:     Rate and Rhythm: Normal rate and regular rhythm.     Heart sounds: No murmur heard. Pulmonary:     Effort: Pulmonary effort is normal. No respiratory distress.     Breath sounds: Normal breath sounds.  Abdominal:     General: Bowel sounds are normal.     Palpations: Abdomen is soft.     Tenderness: There is abdominal tenderness in the right lower quadrant. There is no guarding or rebound.  Musculoskeletal:        General: No swelling.     Cervical back: Neck supple.  Skin:    General: Skin is warm and dry.     Capillary Refill: Capillary refill takes less than 2 seconds.  Neurological:     General: No focal deficit present.     Mental Status: She is alert and oriented to person, place, and time.     GCS: GCS eye subscore is 4. GCS verbal subscore is 5. GCS motor subscore is 6.     Cranial Nerves: No cranial nerve deficit.  Sensory: No sensory deficit.  Psychiatric:        Attention and Perception: Attention normal.        Mood and Affect: Mood normal.        Speech: Speech normal.      UC Treatments / Results  Labs (all labs ordered are  listed, but only abnormal results are displayed) Labs Reviewed - No data to display  EKG   Radiology No results found.  Procedures Procedures (including critical care time)  Medications Ordered in UC Medications - No data to display  Initial Impression / Assessment and Plan / UC Course  I have reviewed the triage vital signs and the nursing notes.  Pertinent labs & imaging results that were available during my care of the patient were reviewed by me and considered in my medical decision making (see chart for details).    Discussed exam findings and plan of care with patient, strict go to ER precautions given, patient verbalized understanding this provider  Ddx: Diarrhea, right lower quadrant abdominal pain, viral illness Final Clinical Impressions(s) / UC Diagnoses   Final diagnoses:  Diarrhea, unspecified type  RLQ abdominal pain     Discharge Instructions      Take home meds as directed. If you develop fever,worsening symptoms,unable to keep fluids down, etc,  go to ER for further evaluation(cannot r/o appendicitis in office).     ED Prescriptions   None    PDMP not reviewed this encounter.   Clancy Gourd, NP 11/18/23 Rickey Primus

## 2023-11-18 NOTE — Discharge Instructions (Signed)
Take home meds as directed. If you develop fever,worsening symptoms,unable to keep fluids down, etc,  go to ER for further evaluation(cannot r/o appendicitis in office).

## 2024-03-16 ENCOUNTER — Other Ambulatory Visit: Payer: Self-pay

## 2024-03-16 ENCOUNTER — Emergency Department
Admission: EM | Admit: 2024-03-16 | Discharge: 2024-03-17 | Disposition: A | Discharge status: Other Acute Inpatient Hospital | Attending: Emergency Medicine | Admitting: Emergency Medicine

## 2024-03-16 ENCOUNTER — Encounter: Payer: Self-pay | Admitting: Intensive Care

## 2024-03-16 DIAGNOSIS — R45851 Suicidal ideations: Secondary | ICD-10-CM | POA: Insufficient documentation

## 2024-03-16 DIAGNOSIS — Z91199 Patient's noncompliance with other medical treatment and regimen due to unspecified reason: Secondary | ICD-10-CM | POA: Insufficient documentation

## 2024-03-16 DIAGNOSIS — X58XXXA Exposure to other specified factors, initial encounter: Secondary | ICD-10-CM | POA: Diagnosis not present

## 2024-03-16 DIAGNOSIS — F331 Major depressive disorder, recurrent, moderate: Secondary | ICD-10-CM | POA: Insufficient documentation

## 2024-03-16 DIAGNOSIS — F411 Generalized anxiety disorder: Secondary | ICD-10-CM | POA: Insufficient documentation

## 2024-03-16 DIAGNOSIS — S60812A Abrasion of left wrist, initial encounter: Secondary | ICD-10-CM | POA: Diagnosis not present

## 2024-03-16 DIAGNOSIS — S60811A Abrasion of right wrist, initial encounter: Secondary | ICD-10-CM | POA: Diagnosis not present

## 2024-03-16 DIAGNOSIS — S6992XA Unspecified injury of left wrist, hand and finger(s), initial encounter: Secondary | ICD-10-CM | POA: Diagnosis present

## 2024-03-16 LAB — URINE DRUG SCREEN, QUALITATIVE (ARMC ONLY)
Amphetamines, Ur Screen: NOT DETECTED
Barbiturates, Ur Screen: NOT DETECTED
Benzodiazepine, Ur Scrn: NOT DETECTED
Cannabinoid 50 Ng, Ur ~~LOC~~: NOT DETECTED
Cocaine Metabolite,Ur ~~LOC~~: NOT DETECTED
MDMA (Ecstasy)Ur Screen: NOT DETECTED
Methadone Scn, Ur: NOT DETECTED
Opiate, Ur Screen: NOT DETECTED
Phencyclidine (PCP) Ur S: NOT DETECTED
Tricyclic, Ur Screen: NOT DETECTED

## 2024-03-16 LAB — CBC
HCT: 49.6 % — ABNORMAL HIGH (ref 36.0–46.0)
Hemoglobin: 16.8 g/dL — ABNORMAL HIGH (ref 12.0–15.0)
MCH: 27.9 pg (ref 26.0–34.0)
MCHC: 33.9 g/dL (ref 30.0–36.0)
MCV: 82.3 fL (ref 80.0–100.0)
Platelets: 284 10*3/uL (ref 150–400)
RBC: 6.03 MIL/uL — ABNORMAL HIGH (ref 3.87–5.11)
RDW: 13.9 % (ref 11.5–15.5)
WBC: 12.7 10*3/uL — ABNORMAL HIGH (ref 4.0–10.5)
nRBC: 0 % (ref 0.0–0.2)

## 2024-03-16 LAB — COMPREHENSIVE METABOLIC PANEL WITH GFR
ALT: 15 U/L (ref 0–44)
AST: 13 U/L — ABNORMAL LOW (ref 15–41)
Albumin: 4.5 g/dL (ref 3.5–5.0)
Alkaline Phosphatase: 51 U/L (ref 38–126)
Anion gap: 12 (ref 5–15)
BUN: 18 mg/dL (ref 6–20)
CO2: 21 mmol/L — ABNORMAL LOW (ref 22–32)
Calcium: 9.5 mg/dL (ref 8.9–10.3)
Chloride: 106 mmol/L (ref 98–111)
Creatinine, Ser: 0.58 mg/dL (ref 0.44–1.00)
GFR, Estimated: >60 mL/min (ref >60)
Glucose, Bld: 117 mg/dL — ABNORMAL HIGH (ref 70–99)
Potassium: 2.8 mmol/L — ABNORMAL LOW (ref 3.5–5.1)
Sodium: 139 mmol/L (ref 135–145)
Total Bilirubin: 1.2 mg/dL (ref 0.0–1.2)
Total Protein: 7.7 g/dL (ref 6.5–8.1)

## 2024-03-16 LAB — PREGNANCY, URINE: Preg Test, Ur: NEGATIVE

## 2024-03-16 LAB — ETHANOL: Alcohol, Ethyl (B): <15 mg/dL (ref <15)

## 2024-03-16 MED ORDER — IBUPROFEN 600 MG PO TABS: 600.0000 mg | ORAL_TABLET | Freq: Three times a day (TID) | ORAL | Status: DC | PRN

## 2024-03-16 MED ORDER — ONDANSETRON HCL 4 MG PO TABS: 4.0000 mg | ORAL_TABLET | Freq: Three times a day (TID) | ORAL | Status: DC | PRN

## 2024-03-16 MED ORDER — SPIRONOLACTONE 100 MG PO TABS
100.0000 mg | ORAL_TABLET | Freq: Every day | ORAL | Status: DC
  Administered 2024-03-17: 100 mg via ORAL
  Filled 2024-03-16: qty 4
  Filled 2024-03-16: qty 1

## 2024-03-16 MED ORDER — METOPROLOL SUCCINATE ER 50 MG PO TB24
25.0000 mg | ORAL_TABLET | Freq: Every day | ORAL | Status: DC
  Filled 2024-03-16: qty 1

## 2024-03-16 MED ORDER — ALUM & MAG HYDROXIDE-SIMETH 200-200-20 MG/5ML PO SUSP: 30.0000 mL | Freq: Four times a day (QID) | ORAL | Status: DC | PRN

## 2024-03-16 NOTE — Consult Note (Incomplete)
 [facility] Psychiatric Consult Initial  Patient Name: .Tiffany Andrade  MRN: [id number]  DOB: [birth year]  Consult Order details:  Orders (From admission, onward)     Start     Ordered   03/16/24 1510  CONSULT TO CALL ACT TEAM       Ordering Provider: Jacquie Maudlin, MD  Provider:  (Not yet assigned)  Question:  Reason for Consult?  Answer:  Psych consult   03/16/24 1509   03/16/24 1510  IP CONSULT TO PSYCHIATRY       Ordering Provider: Jacquie Maudlin, MD  Provider:  (Not yet assigned)  Question Answer Comment  Consult Timeframe URGENT - requires response within 12 hours   URGENT timeframe requires provider to provider communication, has the provider to provider communication been completed Yes   Reason for Consult? Consult for medication management   Contact phone number where the requesting provider can be reached [phone]      03/16/24 1509             Mode of Visit: Tele-visit Virtual Statement:TELE PSYCHIATRY ATTESTATION & CONSENT As the provider for this telehealth consult, I attest that I verified the patient's identity using two separate identifiers, introduced myself to the patient, provided my credentials, disclosed my location, and performed this encounter via a HIPAA-compliant, real-time, face-to-face, two-way, interactive audio and video platform and with the full consent and agreement of the patient (or guardian as applicable.) Patient physical location: [facility]. Telehealth provider physical location: home office in state of [place] .   Video start time:   Video end time:      Psychiatry Consult Evaluation  Service Date: Mar 16, 2024 LOS:  LOS: 0 days  Chief Complaint "I told my doctor I was suicidal because of my anxiety, and she told me to come to the hospital."    Primary Psychiatric Diagnoses  F33.1 - Major Depressive Disorder, recurrent, moderate  F41.1 - Generalized Anxiety Disorder  R45.851 - Suicidal ideation  Z91.19 - Noncompliance with medical  treatment and regimen (patient concern over medication efficacy)    Assessment  Tiffany Andrade presents with ongoing suicidal ideation, a stated plan, history of prior suicide attempts, and poor response to her current psychiatric medications. She is currently at risk due to impaired judgment, persistent emotional distress, and inability to guarantee safety. Her insight is partial, as she recognizes her psychiatric needs but expresses strong opposition to inpatient treatment based on financial concerns and previous negative experiences. Despite this, she remains a danger to herself at this time and meets criteria for involuntary inpatient psychiatric admission.   Diagnoses:  F33.1 - Major Depressive Disorder, recurrent, moderate F41.1 - Generalized Anxiety Disorder R45.851 - Suicidal ideation Z91.19 - Noncompliance with medical treatment and regimen (patient concern over medication efficacy)      Plan   ## Psychiatric Medication Recommendations:  ***  ## Medical Decision Making Capacity: Not specifically addressed in this encounter   ## Disposition:-- We recommend inpatient psychiatric hospitalization when medically cleared. Patient is under voluntary admission status at this time; please IVC if attempts to leave hospital.  ## Behavioral / Environmental: - No specific recommendations at this time.     ## Safety and Observation Level:  - Based on my clinical evaluation, I estimate the patient to be at moderate  risk of self harm in the current setting. - At this time, we recommend  routine. This decision is based on my review of the chart including patient's history and current presentation, interview  of the patient, mental status examination, and consideration of suicide risk including evaluating suicidal ideation, plan, intent, suicidal or self-harm behaviors, risk factors, and protective factors. This judgment is based on our ability to directly address suicide risk, implement suicide  prevention strategies, and develop a safety plan while the patient is in the clinical setting. Please contact our team if there is a concern that risk level has changed.  CSSR Risk Category:C-SSRS RISK CATEGORY: High Risk  Suicide Risk Assessment: Patient has following modifiable risk factors for suicide: active suicidal ideation, untreated depression, and social isolation, which we are addressing by recommending inpatient hospitalization. Patient has following non-modifiable or demographic risk factors for suicide: history of suicide attempt, history of self harm behavior, and psychiatric hospitalization Patient has the following protective factors against suicide: Access to outpatient mental health care and Supportive family  Thank you for this consult request. Recommendations have been communicated to the primary team.  We will recommend inpatient  at this time.   Al Alias, NP       History of Present Illness  Tiffany Andrade is a 48 y.o. female presenting to the emergency department following a referral from her primary care provider after disclosing active suicidal ideation. The patient reports worsening anxiety and frustration related to her current psychiatric medication regimen, which she feels is ineffective. She states that this distress led to suicidal thoughts and a plan to overdose on pills, which she disclosed to her doctor. She has a history of two prior suicide attempts, with the most recent in 2010 involving an overdose.  The patient was transported to the ED via sheriff escort, which she found distressing. She voiced concerns about being taken to [facility] instead of [facility], citing issues with the sheriff's navigation and departmental directives. She expressed feeling criminalized by being handcuffed during transport.  During the assessment, the patient continued to report active suicidal ideation, attributing this to her anxiety and ongoing stress. She stated that the ED  environment (specifically the presence of children) was exacerbating her distress. She denied auditory or visual hallucinations and denied homicidal ideation. She was oriented, articulate, and appropriate in presentation, though she became verbally aggressive toward the end of the evaluation, expressing frustration with the process and providers.   Review of Systems  Psychiatric/Behavioral:  Positive for depression and suicidal ideas.   All other systems reviewed and are negative.    Psychiatric and Social History  Psychiatric History:  Information collected from patient and chart review  Prev Dx/Sx: *** Current Psych Provider: *** Home Meds (current): *** Previous Med Trials: *** Therapy: ***  Prior Psych Hospitalization: ***  Prior Self Harm: *** Prior Violence: ***  Family Psych History: *** Family Hx suicide: ***  Social History:  Developmental Hx: *** Educational Hx: *** Occupational Hx: *** Legal Hx: *** Living Situation: *** Spiritual Hx: *** Access to weapons/lethal means: ***   Substance History Alcohol: ***  Type of alcohol *** Last Drink *** Number of drinks per day *** History of alcohol withdrawal seizures *** History of DT's *** Tobacco: *** Illicit drugs: *** Prescription drug abuse: *** Rehab hx: ***  Exam Findings  Physical Exam:  Vital Signs:  Temp:  [97.5 F (36.4 C)-98 F (36.7 C)] 98 F (36.7 C) (05/16 2004) Pulse Rate:  [85-97] 85 (05/16 2004) Resp:  [18] 18 (05/16 2004) BP: (140-161)/(90-101) 140/90 (05/16 2004) SpO2:  [95 %-100 %] 95 % (05/16 2004) Blood pressure (!) 140/90, pulse 85, temperature 98 F (36.7 C),  temperature source Oral, resp. rate 18, height 4\' 11"  (1.499 m), last menstrual period 10/25/2016, SpO2 95%. Body mass index is 39.99 kg/m.  Physical Exam Vitals and nursing note reviewed.  Constitutional:      Appearance: Normal appearance.  HENT:     Head: Normocephalic and atraumatic.     Nose: Nose normal.      Mouth/Throat:     Mouth: Mucous membranes are dry.  Eyes:     Pupils: Pupils are equal, round, and reactive to light.  Pulmonary:     Effort: Pulmonary effort is normal.  Musculoskeletal:        General: Normal range of motion.     Cervical back: Normal range of motion.  Skin:    General: Skin is dry.  Neurological:     Mental Status: She is alert and oriented to person, place, and time.  Psychiatric:        Attention and Perception: Attention and perception normal.        Mood and Affect: Mood is anxious. Affect is labile, angry and inappropriate.        Speech: Speech normal.        Behavior: Behavior is uncooperative, agitated, aggressive and combative.        Thought Content: Thought content includes suicidal ideation. Thought content includes suicidal plan.        Cognition and Memory: Cognition normal.        Judgment: Judgment is impulsive and inappropriate.     Mental Status Exam: General Appearance: Disheveled  Orientation:  Full (Time, Place, and Person)  Memory:  Immediate;   Fair Recent;   Fair  Concentration:  Concentration: Fair and Attention Span: Fair  Recall:  Fair  Attention  Fair  Eye Contact:  Fair  Speech:  {Speech:22685}  Language:  {BHH GOOD/FAIR/POOR:22877}  Volume:  {Volume (PAA):22686}  Mood: ***  Affect:  {Affect (PAA):22687}  Thought Process:  {Thought Process (PAA):22688}  Thought Content:  {Thought Content:22690}  Suicidal Thoughts:  {ST/HT (PAA):22692}  Homicidal Thoughts:  {ST/HT (PAA):22692}  Judgement:  {Judgement (PAA):22694}  Insight:  {Insight (PAA):22695}  Psychomotor Activity:  {Psychomotor (PAA):22696}  Akathisia:  {BHH YES OR NO:22294}  Fund of Knowledge:  {BHH GOOD/FAIR/POOR:22877}      Assets:  {Assets (PAA):22698}  Cognition:  {chl bhh cognition:304700322}  ADL's:  {BHH WUJ'W:11914}  AIMS (if indicated):        Other History   These have been pulled in through the EMR, reviewed, and updated if appropriate.  Family  History:  The patient's family history includes Hypertension in her brother, mother, and sister.  Medical History: Past Medical History:  Diagnosis Date  . Anxiety   . Depression   . DVT (deep venous thrombosis) (HCC)   . Hypertension   . UTI (lower urinary tract infection)     Surgical History: Past Surgical History:  Procedure Laterality Date  . CESAREAN SECTION     x2  . FOOT SURGERY Right   . KIDNEY SURGERY     X2  . TONSILLECTOMY    . TUBAL LIGATION       Medications:   Current Facility-Administered Medications:  .  alum & mag hydroxide-simeth (MAALOX/MYLANTA) 200-200-20 MG/5ML suspension 30 mL, 30 mL, Oral, Q6H PRN, Jacquie Maudlin, MD .  ibuprofen (ADVIL) tablet 600 mg, 600 mg, Oral, Q8H PRN, Jacquie Maudlin, MD .  Cecily Cohen ON 03/17/2024] metoprolol succinate (TOPROL-XL) 24 hr tablet 25 mg, 25 mg, Oral, Daily, Jacquie Maudlin, MD .  ondansetron  (  ZOFRAN ) tablet 4 mg, 4 mg, Oral, Q8H PRN, Jacquie Maudlin, MD .  Cecily Cohen ON 03/17/2024] spironolactone (ALDACTONE) tablet 100 mg, 100 mg, Oral, Daily, Jacquie Maudlin, MD  Current Outpatient Medications:  .  albuterol  (VENTOLIN  HFA) 108 (90 Base) MCG/ACT inhaler, Inhale 2 puffs into the lungs every 4 (four) hours as needed., Disp: 18 g, Rfl: 0 .  amLODipine (NORVASC) 5 MG tablet, Take by mouth., Disp: , Rfl:  .  B Complex-C-Calcium  (GNP B-COMPLEX PLUS VITAMIN C) TABS, Take by mouth., Disp: , Rfl:  .  beclomethasone (QVAR REDIHALER) 40 MCG/ACT inhaler, Inhale into the lungs., Disp: , Rfl:  .  benzonatate  (TESSALON ) 100 MG capsule, Take 1 capsule (100 mg total) by mouth every 8 (eight) hours., Disp: 21 capsule, Rfl: 0 .  busPIRone (BUSPAR) 10 MG tablet, Take 5 mg by mouth 2 (two) times daily., Disp: , Rfl:  .  candesartan (ATACAND) 32 MG tablet, Take by mouth., Disp: , Rfl:  .  cetirizine (ZYRTEC) 10 MG tablet, Take 10 mg by mouth daily., Disp: , Rfl:  .  chlorthalidone (HYGROTON) 25 MG tablet, Take 25 mg by mouth daily.,  Disp: , Rfl:  .  cyclobenzaprine (FLEXERIL) 10 MG tablet, Take 10 mg by mouth at bedtime as needed., Disp: , Rfl:  .  DULoxetine (CYMBALTA) 60 MG capsule, Take 60 mg by mouth daily., Disp: , Rfl:  .  fluticasone (FLONASE) 50 MCG/ACT nasal spray, Place 1 spray into both nostrils daily., Disp: , Rfl:  .  Galcanezumab-gnlm 120 MG/ML SOAJ, Inject into the skin., Disp: , Rfl:  .  Galcanezumab-gnlm 120 MG/ML SOSY, Inject into the skin., Disp: , Rfl:  .  hydrOXYzine (VISTARIL) 25 MG capsule, Take 25-50 mg by mouth 2 (two) times daily as needed., Disp: , Rfl:  .  lidocaine  (LIDODERM ) 5 %, SMARTSIG:Patch(s) Topical, Disp: , Rfl:  .  MAGNESIUM PO, Take by mouth., Disp: , Rfl:  .  meclizine  (MEDI-MECLIZINE ) 25 MG tablet, Take 1 tablet (25 mg total) by mouth 3 (three) times daily as needed for dizziness., Disp: 30 tablet, Rfl: 0 .  meloxicam  (MOBIC ) 15 MG tablet, Take by mouth., Disp: , Rfl:  .  methocarbamol (ROBAXIN) 500 MG tablet, Take 500 mg by mouth 2 (two) times daily as needed., Disp: , Rfl:  .  mometasone-formoterol (DULERA) 100-5 MCG/ACT AERO, INHALE 2 PUFFS AS DIRECTED TWICE A DAY, Disp: , Rfl:  .  mupirocin  nasal ointment (BACTROBAN ) 2 %, Apply to wound 3 times a day., Disp: 22 g, Rfl: 0 .  nystatin ointment (MYCOSTATIN), Apply topically., Disp: , Rfl:  .  Olopatadine HCl 0.2 % SOLN, Apply 1 drop to eye daily., Disp: , Rfl:  .  ondansetron  (ZOFRAN -ODT) 4 MG disintegrating tablet, Take 1 tablet (4 mg total) by mouth every 8 (eight) hours as needed for nausea or vomiting., Disp: 15 tablet, Rfl: 0 .  OZEMPIC, 0.25 OR 0.5 MG/DOSE, 2 MG/1.5ML SOPN, Inject into the skin., Disp: , Rfl:  .  polyethylene glycol powder (GLYCOLAX/MIRALAX) 17 GM/SCOOP powder, Take by mouth., Disp: , Rfl:  .  POTASSIUM PO, Take by mouth., Disp: , Rfl:  .  promethazine -dextromethorphan (PROMETHAZINE -DM) 6.25-15 MG/5ML syrup, Take 5 mLs by mouth 4 (four) times daily as needed for cough., Disp: 118 mL, Rfl: 0 .  rizatriptan  (MAXALT-MLT) 10 MG disintegrating tablet, Give one tablet may repeat in 2 hours if needed.  No more than 4 in 1 week, Disp: , Rfl:  .  senna (SENOKOT) 8.6 MG tablet,  Take 2 tablets by mouth at bedtime., Disp: , Rfl:  .  sertraline (ZOLOFT) 100 MG tablet, Take 1 tablet by mouth daily., Disp: , Rfl:  .  sertraline (ZOLOFT) 50 MG tablet, PLEASE SEE ATTACHED FOR DETAILED DIRECTIONS, Disp: , Rfl:  .  solifenacin (VESICARE) 5 MG tablet, Take 1 tablet by mouth daily., Disp: , Rfl:  .  Spacer/Aero-Holding Chambers (AEROCHAMBER MV) inhaler, Use as instructed, Disp: 1 each, Rfl: 2 .  spironolactone (ALDACTONE) 50 MG tablet, Take 50 mg by mouth daily., Disp: , Rfl:  .  tiZANidine (ZANAFLEX) 4 MG tablet, Take 2-4 mg by mouth 2 (two) times daily as needed., Disp: , Rfl:  .  topiramate (TOPAMAX) 25 MG tablet, Take 25 mg by mouth daily., Disp: , Rfl:  .  traMADol (ULTRAM) 50 MG tablet, Take by mouth., Disp: , Rfl:  .  valACYclovir  (VALTREX ) 1000 MG tablet, Take 1 tablet (1,000 mg total) by mouth 3 (three) times daily., Disp: 21 tablet, Rfl: 0  Allergies: Allergies  Allergen Reactions  . Levofloxacin  Hives    Other reaction(s): Other (See Comments)  . Oxybutynin Hives and Swelling    Other reaction(s): Other (See Comments)  . Codeine Other (See Comments)    Patient states she gets real bad headaches. Other reaction(s): Other (See Comments) Patient states she gets real bad headaches. HA  . Iodinated Contrast Media Hives and Other (See Comments)    Patient states her tongue starts tingling. Other reaction(s): Other (See Comments), Unknown Blood pressure gets low, itchy nose and tongue. Blood pressure gets low, itchy nose and tongue.   . Other Hives    Other reaction(s): Other (See Comments) Patient states her tongue starts tingling.  Aaron Aas Penicillins Hives    Has patient had a PCN reaction causing immediate rash, facial/tongue/throat swelling, SOB or lightheadedness with hypotension: No Has patient had  a PCN reaction causing severe rash involving mucus membranes or skin necrosis: No Has patient had a PCN reaction that required hospitalization No Has patient had a PCN reaction occurring within the last 10 years: Yes If all of the above answers are "NO", then may proceed with Cephalosporin use. Other reaction(s): Other (See Comments)  . Buprenorphine Nausea And Vomiting and Other (See Comments)    Hot flashes  . Sulfa  Antibiotics Other (See Comments)    Patient states this medication does not work for her, have to take stronger dose for a longer period of time.  Other reaction(s): Other (See Comments)  Patient states she is immune to it.  Says she has to take double the amount for twice as long for it to work.    Patient states this medication does not work for her, have to take stronger dose for a longer period of time.  Patient states she is immune to it.  Says she has to take double the amount for twice as long for it to work.    Patient states this medication does not work for her, have to take stronger dose for a longer period of time.    Patient states this medication does not work for her, have to take stronger dose for a longer period of time.  Other reaction(s): Other (See Comments)  Patient states she is immune to it.  Says she has to take double the amount for twice as long for it to work.  Patient states this medication does not work for her, have to take stronger dose for a longer period of time.  . Tape  Itching and Other (See Comments)    blisters  Other reaction(s): Other (See Comments)    Al Alias, NP

## 2024-03-16 NOTE — ED Provider Notes (Signed)
 [facility] Provider Note    Event Date/Time   First MD Initiated Contact with Patient 03/16/24 1508     (approximate)   History   Chief Complaint: Suicidal   HPI  Tiffany Andrade is a 48 y.o. female who was sent to the ED under IVC from her PCP office due to suicidal thoughts with a plan to overdose on medication.  Patient was transported by police with handcuffs in place due to her resistance.  She currently complains of pain on bilateral hands from the handcuffs, no other acute complaints.  Denies any other acute injuries, otherwise list multiple chronic pain issues which have been present for "years."     Physical Exam   Triage Vital Signs: ED Triage Vitals  Encounter Vitals Group     BP 03/16/24 1436 (!) 161/101     Systolic BP Percentile --      Diastolic BP Percentile --      Pulse Rate 03/16/24 1436 97     Resp 03/16/24 1436 18     Temp 03/16/24 1436 (!) 97.5 F (36.4 C)     Temp Source 03/16/24 1436 Oral     SpO2 03/16/24 1436 100 %     Weight --      Height 03/16/24 1417 4\' 11"  (1.499 m)     Head Circumference --      Peak Flow --      Pain Score 03/16/24 1417 7     Pain Loc --      Pain Education --      Exclude from Growth Chart --     Most recent vital signs: Vitals:   03/16/24 1436  BP: (!) 161/101  Pulse: 97  Resp: 18  Temp: (!) 97.5 F (36.4 C)  SpO2: 100%    General: Awake, no distress. CV:  Good peripheral perfusion.  Resp:  Normal effort.  Abd:  No distention.  Other:  No wounds.  There is contusion and mild swelling over the dorsal thenar eminence bilaterally.  No bony point tenderness.  Full range of motion in flexors extensors lumbricals.  Stress of the metacarpals and palpation of the carpals is nontender.  No lacerations.  There are superficial linear abrasions on the volar wrists bilaterally.   ED Results / Procedures / Treatments   Labs (all labs ordered are listed, but only abnormal results are  displayed) Labs Reviewed  COMPREHENSIVE METABOLIC PANEL WITH GFR - Abnormal; Notable for the following components:      Result Value   Potassium 2.8 (*)    CO2 21 (*)    Glucose, Bld 117 (*)    AST 13 (*)    All other components within normal limits  CBC - Abnormal; Notable for the following components:   WBC 12.7 (*)    RBC 6.03 (*)    Hemoglobin 16.8 (*)    HCT 49.6 (*)    All other components within normal limits  ETHANOL  URINE DRUG SCREEN, QUALITATIVE ([facility] ONLY)     EKG    RADIOLOGY    PROCEDURES:  Procedures   MEDICATIONS ORDERED IN ED: Medications  ibuprofen (ADVIL) tablet 600 mg (has no administration in time range)  ondansetron  (ZOFRAN ) tablet 4 mg (has no administration in time range)  alum & mag hydroxide-simeth (MAALOX/MYLANTA) 200-200-20 MG/5ML suspension 30 mL (has no administration in time range)  spironolactone (ALDACTONE) tablet 100 mg (has no administration in time range)  metoprolol succinate (TOPROL-XL) 24 hr  tablet 25 mg (has no administration in time range)     IMPRESSION / MDM / ASSESSMENT AND PLAN / ED COURSE  I reviewed the triage vital signs and the nursing notes.  Patient's presentation is most consistent with acute presentation with potential threat to life or bodily function.  Patient presents under IVC due to SI with a plan to overdose on medication.  Will continue to provide a safe environment, continue her home medications, await psychiatry recommendations.  The patient has been placed in psychiatric observation due to the need to provide a safe environment for the patient while obtaining psychiatric consultation and evaluation, as well as ongoing medical and medication management to treat the patient's condition.  The patient has been placed under full IVC at this time.      FINAL CLINICAL IMPRESSION(S) / ED DIAGNOSES   Final diagnoses:  Suicidal ideation     Rx / DC Orders   ED Discharge Orders     None         Note:  This document was prepared using Dragon voice recognition software and may include unintentional dictation errors.   Jacquie Maudlin, MD 03/16/24 1739

## 2024-03-16 NOTE — ED Notes (Signed)
 Son came and got patients keys.

## 2024-03-16 NOTE — ED Notes (Signed)
 Pt still had bra. Tech took pt bra. Pt is caox4, in no acute distress and stating things like "I am not using that toilet. I am a female. That looks like a truck stop bathroom and you will give me a bedpan".

## 2024-03-16 NOTE — ED Notes (Signed)
 Pt Refused Snack

## 2024-03-16 NOTE — ED Notes (Signed)
 Pt son has called multiple times demanding to have her transferred to [facility]. We took down his number and informed him that we do not just transfer pt from ED to ED.

## 2024-03-16 NOTE — ED Notes (Signed)
 Pt asked about her prescribed meds. Pt states "I take Zetbound and use a sleep apnea mask at bedtime". MD made aware so a med rec could be done.

## 2024-03-16 NOTE — ED Notes (Addendum)
 Pt dressed out by this EDT and Fabian Holster, EDT. Pt belongings include:  Purple and green shirt Blue pants Blue shoes Gray socks Pink panties  Green bra Phone  [employer]  PT REFUSED TO TAKE BRA OFF

## 2024-03-16 NOTE — BH Assessment (Signed)
 Comprehensive Clinical Assessment (CCA) Note  03/16/2024 Tiffany Andrade [id number]  Chief Complaint:  Chief Complaint  Patient presents with   Suicidal   Visit Diagnosis: Major Depressive Disorder   Tiffany Andrade is a 48 year old female who presents to the ER due to having thoughts and plans to end her life by overdosing. She states she was at her doctor's office and shared how she's been feeling and her thoughts of suicide. While in the ER, she continues to voice SI with the plan of overdosing on her medications. Approximately thirteen years ago, she attempted to end her life by overdosing on blood thinners. Per her report, she didn't require a medical admission but was admitted psychiatrically. Patient denies HI and AV/H. She denies the use of any mind-altering substances and her lab work confirms it.   CCA Screening, Triage and Referral (STR)  Patient Reported Information How did you hear about us ? No data recorded What Is the Reason for Your Visit/Call Today? Brought to the ER due thoughts and plans of ending her life by overdosing on medications.  How Long Has This Been Causing You Problems? 1 wk - 1 month  What Do You Feel Would Help You the Most Today? Treatment for Depression or other mood problem   Have You Recently Had Any Thoughts About Hurting Yourself? Yes  Are You Planning to Commit Suicide/Harm Yourself At This time? Yes   Flowsheet Row ED from 03/16/2024 in [facility] Emergency Department at [facility] UC from 11/18/2023 in [facility] Care at [facility]  UC from 09/21/2023 in [facility] at [facility]   C-SSRS RISK CATEGORY High Risk No Risk No Risk       Have you Recently Had Thoughts About Hurting Someone Tiffany Andrade? No  Are You Planning to Harm Someone at This Time? No  Explanation: No data recorded  Have You Used Any Alcohol or Drugs in the Past 24 Hours? No  How Long Ago Did You Use Drugs or Alcohol? No data recorded What Did You  Use and How Much? No data recorded  Do You Currently Have a Therapist/Psychiatrist? Yes  Name of Therapist/Psychiatrist: Name of Therapist/Psychiatrist: [facility]   Have You Been Recently Discharged From Any Office Practice or Programs? No  Explanation of Discharge From Practice/Program: No data recorded    CCA Screening Triage Referral Assessment Type of Contact: Face-to-Face  Telemedicine Service Delivery:   Is this Initial or Reassessment?   Date Telepsych consult ordered in [facility]:    Time Telepsych consult ordered in [facility]:    Location of Assessment: [facility] ED  Provider Location: [facility] ED   Collateral Involvement: No data recorded  Does Patient Have a Court Appointed Legal Guardian? No data recorded Legal Guardian Contact Information: No data recorded Copy of Legal Guardianship Form: No data recorded Legal Guardian Notified of Arrival: No data recorded Legal Guardian Notified of Pending Discharge: No data recorded If Minor and Not Living with Parent(s), Who has Custody? No data recorded Is CPS involved or ever been involved? Never  Is APS involved or ever been involved? Never   Patient Determined To Be At Risk for Harm To Self or Others Based on Review of Patient Reported Information or Presenting Complaint? Yes, for Self-Harm  Method: No data recorded Availability of Means: No data recorded Intent: No data recorded Notification Required: No data recorded Additional Information for Danger to Others Potential: No data recorded Additional Comments for Danger to Others Potential: No data recorded Are There  Guns or Other Weapons in Your Home? No data recorded Types of Guns/Weapons: No data recorded Are These Weapons Safely Secured?                            No data recorded Who Could Verify You Are Able To Have These Secured: No data recorded Do You Have any Outstanding Charges, Pending Court Dates, Parole/Probation? No data recorded Contacted To Inform of Risk of Harm To Self  or Others: No data recorded   Does Patient Present under Involuntary Commitment? Yes    [place] of Residence: [place]   Patient Currently Receiving the Following Services: Medication Management; Individual Therapy   Determination of Need: Emergent (2 hours)   Options For Referral: Inpatient Hospitalization; ED Visit   CCA Biopsychosocial Patient Reported Schizophrenia/Schizoaffective Diagnosis in Past: No   Strengths: Have some insight, have support system and seeking help.   Mental Health Symptoms Depression:  Hopelessness; Irritability; Increase/decrease in appetite; Sleep (too much or little); Worthlessness; Change in energy/activity   Duration of Depressive symptoms: Duration of Depressive Symptoms: Greater than two weeks   Mania:  None   Anxiety:   Restlessness; Worrying; Tension; Difficulty concentrating; Irritability   Psychosis:  None   Duration of Psychotic symptoms:    Trauma:  N/A   Obsessions:  N/A   Compulsions:  N/A   Inattention:  N/A   Hyperactivity/Impulsivity:  N/A   Oppositional/Defiant Behaviors:  N/A   Emotional Irregularity:  N/A   Other Mood/Personality Symptoms:  No data recorded   Mental Status Exam Appearance and self-care  Stature:  Average   Weight:  Average weight   Clothing:  Neat/clean; Age-appropriate   Grooming:  Normal   Cosmetic use:  None   Posture/gait:  Rigid   Motor activity:  -- (Unable to asses-Patient sitting on the bed.)   Sensorium  Attention:  Normal   Concentration:  Normal   Orientation:  X5   Recall/memory:  Normal   Affect and Mood  Affect:  Appropriate; Depressed   Mood:  Depressed   Relating  Eye contact:  Normal   Facial expression:  Depressed; Anxious   Attitude toward examiner:  Cooperative   Thought and Language  Speech flow: Clear and Coherent   Thought content:  Appropriate to Mood and Circumstances   Preoccupation:  None   Hallucinations:  None   Organization:   Intact; Logical   Company secretary of Knowledge:  Average   Intelligence:  Above Average   Abstraction:  Functional   Judgement:  Impaired; Poor   Reality Testing:  Adequate   Insight:  Poor   Decision Making:  Vacilates   Social Functioning  Social Maturity:  Isolates   Social Judgement:  Heedless; Normal   Stress  Stressors:  Family conflict; Financial; Relationship   Coping Ability:  Deficient supports; Overwhelmed   Skill Deficits:  None   Supports:  Friends/Service system     Religion:    Leisure/Recreation: Leisure / Recreation Do You Have Hobbies?: No  Exercise/Diet: Exercise/Diet Do You Exercise?: No Have You Gained or Lost A Significant Amount of Weight in the Past Six Months?: No Do You Follow a Special Diet?: No Do You Have Any Trouble Sleeping?: Yes Explanation of Sleeping Difficulties: Sleeps approximately three to four hours a day.   CCA Employment/Education Employment/Work Situation: Employment / Work Situation Employment Situation: Employed Patient's Job has Been Impacted by Current Illness: No  Education: Education  Is Patient Currently Attending School?: No Did You Have An Individualized Education Program (IIEP): No Did You Have Any Difficulty At School?: No Patient's Education Has Been Impacted by Current Illness: No   CCA Family/Childhood History Family and Relationship History: Family history Marital status: Single Does patient have children?: Yes How many children?: 2 How is patient's relationship with their children?: States they are causing her stress.  Childhood History:  Childhood History By whom was/is the patient raised?: Mother Did patient suffer any verbal/emotional/physical/sexual abuse as a child?: No Did patient suffer from severe childhood neglect?: No Has patient ever been sexually abused/assaulted/raped as an adolescent or adult?: No Was the patient ever a victim of a crime or a disaster?:  No Witnessed domestic violence?: No Has patient been affected by domestic violence as an adult?: No   CCA Substance Use Alcohol/Drug Use: Alcohol / Drug Use Pain Medications: See MAR Prescriptions: See MAR Over the Counter: See MAR History of alcohol / drug use?: No history of alcohol / drug abuse Longest period of sobriety (when/how long): n/a   ASAM's:  Six Dimensions of Multidimensional Assessment  Dimension 1:  Acute Intoxication and/or Withdrawal Potential:      Dimension 2:  Biomedical Conditions and Complications:      Dimension 3:  Emotional, Behavioral, or Cognitive Conditions and Complications:     Dimension 4:  Readiness to Change:     Dimension 5:  Relapse, Continued use, or Continued Problem Potential:     Dimension 6:  Recovery/Living Environment:     ASAM Severity Score:    ASAM Recommended Level of Treatment:     Substance use Disorder (SUD)    Recommendations for Services/Supports/Treatments:    Disposition Recommendation per psychiatric provider: Pending Psych Consult   DSM5 Diagnoses: Patient Active Problem List   Diagnosis Date Noted   Diarrhea 11/18/2023   RLQ abdominal pain 11/18/2023   Insomnia 12/21/2021   PTSD (post-traumatic stress disorder) 09/11/2021   Moderate episode of recurrent major depressive disorder (HCC) 09/11/2021   Depression 12/24/2020   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   Left homonymous hemianopsia 09/22/2020   Other chronic pain 02/15/2020   Chest pain 05/25/2018   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   Right leg pain 04/14/2018   Migraine without aura and without status migrainosus, not intractable 06/03/2017   Chronic cystitis 04/06/2017   Hematuria, microscopic 04/06/2017   Incomplete emptying of bladder 04/06/2017   Ureteral mass 04/06/2017   Unilateral ureteral reflux 04/06/2017   Right arm pain 11/12/2013   Anemia 09/25/2013   Constipation 09/25/2013   Dysmenorrhea 09/25/2013   Menorrhagia  09/25/2013   Hematochezia 05/08/2013   Obstructive sleep apnea 12/26/2012   Essential (primary) hypertension 10/19/2012   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

## 2024-03-16 NOTE — Consult Note (Signed)
 [facility] Psychiatric Consult Initial  Patient Name: .Tiffany Andrade  MRN: [id number]  DOB: [birth year]  Consult Order details:  Orders (From admission, onward)     Start     Ordered   03/16/24 1510  CONSULT TO CALL ACT TEAM       Ordering Provider: Jacquie Maudlin, MD  Provider:  (Not yet assigned)  Question:  Reason for Consult?  Answer:  Psych consult   03/16/24 1509   03/16/24 1510  IP CONSULT TO PSYCHIATRY       Ordering Provider: Jacquie Maudlin, MD  Provider:  (Not yet assigned)  Question Answer Comment  Consult Timeframe URGENT - requires response within 12 hours   URGENT timeframe requires provider to provider communication, has the provider to provider communication been completed Yes   Reason for Consult? Consult for medication management   Contact phone number where the requesting provider can be reached [phone]      03/16/24 1509             Mode of Visit: Tele-visit Virtual Statement:TELE PSYCHIATRY ATTESTATION & CONSENT As the provider for this telehealth consult, I attest that I verified the patient's identity using two separate identifiers, introduced myself to the patient, provided my credentials, disclosed my location, and performed this encounter via a HIPAA-compliant, real-time, face-to-face, two-way, interactive audio and video platform and with the full consent and agreement of the patient (or guardian as applicable.) Patient physical location: [facility]. Telehealth provider physical location: home office in state of [place] .   Video start time:   Video end time:      Psychiatry Consult Evaluation  Service Date: Mar 16, 2024 LOS:  LOS: 0 days  Chief Complaint "I told my doctor I was suicidal because of my anxiety, and Tiffany Andrade told me to come to the hospital."    Primary Psychiatric Diagnoses  F33.1 - Major Depressive Disorder, recurrent, moderate  F41.1 - Generalized Anxiety Disorder  R45.851 - Suicidal ideation  Z91.19 - Noncompliance with medical  treatment and regimen (patient concern over medication efficacy)    Assessment  Tiffany Andrade presents with ongoing suicidal ideation, a stated plan, history of prior suicide attempts, and poor response to her current psychiatric medications. Tiffany Andrade is currently at risk due to impaired judgment, persistent emotional distress, and inability to guarantee safety. Her insight is partial, as Tiffany Andrade recognizes her psychiatric needs but expresses strong opposition to inpatient treatment based on financial concerns and previous negative experiences. Despite this, Tiffany Andrade remains a danger to herself at this time and meets criteria for involuntary inpatient psychiatric admission.   Diagnoses:  F33.1 - Major Depressive Disorder, recurrent, moderate F41.1 - Generalized Anxiety Disorder R45.851 - Suicidal ideation Z91.19 - Noncompliance with medical treatment and regimen (patient concern over medication efficacy)      Plan   ## Psychiatric Medication Recommendations:  Defer to inpatient psychiatrist to make new medication recommendations  ## Medical Decision Making Capacity: Not specifically addressed in this encounter   ## Disposition:-- We recommend inpatient psychiatric hospitalization when medically cleared. Patient is under voluntary admission status at this time; please IVC if attempts to leave hospital.  ## Behavioral / Environmental: - No specific recommendations at this time.     ## Safety and Observation Level:  - Based on my clinical evaluation, I estimate the patient to be at moderate  risk of self harm in the current setting. - At this time, we recommend  routine. This decision is based on my review of the  chart including patient's history and current presentation, interview of the patient, mental status examination, and consideration of suicide risk including evaluating suicidal ideation, plan, intent, suicidal or self-harm behaviors, risk factors, and protective factors. This judgment is based  on our ability to directly address suicide risk, implement suicide prevention strategies, and develop a safety plan while the patient is in the clinical setting. Please contact our team if there is a concern that risk level has changed.  CSSR Risk Category:C-SSRS RISK CATEGORY: High Risk  Suicide Risk Assessment: Patient has following modifiable risk factors for suicide: active suicidal ideation, untreated depression, and social isolation, which we are addressing by recommending inpatient hospitalization. Patient has following non-modifiable or demographic risk factors for suicide: history of suicide attempt, history of self harm behavior, and psychiatric hospitalization Patient has the following protective factors against suicide: Access to outpatient mental health care and Supportive family  Thank you for this consult request. Recommendations have been communicated to the primary team.  We will recommend inpatient  at this time.   Al Alias, NP       History of Present Illness  Tiffany Andrade is a 48 y.o. female presenting to the emergency department following a referral from her primary care provider after disclosing active suicidal ideation. The patient reports worsening anxiety and frustration related to her current psychiatric medication regimen, which Tiffany Andrade feels is ineffective. Tiffany Andrade states that this distress led to suicidal thoughts and a plan to overdose on pills, which Tiffany Andrade disclosed to her doctor. Tiffany Andrade has a history of two prior suicide attempts, with the most recent in 2010 involving an overdose.  The patient was transported to the ED via sheriff escort, which Tiffany Andrade found distressing. Tiffany Andrade voiced concerns about being taken to [facility] instead of [facility], citing issues with the sheriff's navigation and departmental directives. Tiffany Andrade expressed feeling criminalized by being handcuffed during transport.  During the assessment, the patient continued to report active suicidal ideation, attributing  this to her anxiety and ongoing stress. Tiffany Andrade stated that the ED environment (specifically the presence of children) was exacerbating her distress. Tiffany Andrade denied auditory or visual hallucinations and denied homicidal ideation. Tiffany Andrade was oriented, articulate, and appropriate in presentation, though Tiffany Andrade became verbally aggressive toward the end of the evaluation, expressing frustration with the process and providers.   Review of Systems  Psychiatric/Behavioral:  Positive for depression and suicidal ideas.   All other systems reviewed and are negative.    Psychiatric and Social History   Social History   Socioeconomic History  . Marital status: Single    Spouse name: Not on file  . Number of children: Not on file  . Years of education: Not on file  . Highest education level: Not on file  Occupational History  . Not on file  Tobacco Use  . Smoking status: Never  . Smokeless tobacco: Never  Vaping Use  . Vaping status: Never Used  Substance and Sexual Activity  . Alcohol use: No    Alcohol/week: 0.0 standard drinks of alcohol  . Drug use: No  . Sexual activity: Not on file  Other Topics Concern  . Not on file  Social History Narrative  . Not on file   Social Drivers of Health   Financial Resource Strain: Medium Risk (02/08/2024)   Received from [facility]   Overall Financial Resource Strain (CARDIA)   . Difficulty of Paying Living Expenses: Somewhat hard  Food Insecurity: Food Insecurity Present (02/08/2024)   [facility]   Hunger Vital Sign   . Worried About Programme researcher, broadcasting/film/video in the Last Year: Sometimes true   . Ran Out of Food in the Last Year: Sometimes true  Transportation Needs: No Transportation Needs (02/08/2024)   Received from [facility]   [facility] - Transportation   . In the past 12 months, has lack of transportation kept you from medical appointments or from getting medications?: No   . Lack of  Transportation (Non-Medical): No  Physical Activity: Not on file  Stress: Not on file  Social Connections: Not on file  Intimate Partner Violence: Not on file     Social History   Socioeconomic History  . Marital status: Single    Spouse name: Not on file  . Number of children: Not on file  . Years of education: Not on file  . Highest education level: Not on file  Occupational History  . Not on file  Tobacco Use  . Smoking status: Never  . Smokeless tobacco: Never  Vaping Use  . Vaping status: Never Used  Substance and Sexual Activity  . Alcohol use: No    Alcohol/week: 0.0 standard drinks of alcohol  . Drug use: No  . Sexual activity: Not on file  Other Topics Concern  . Not on file  Social History Narrative  . Not on file   Social Drivers of Health   Financial Resource Strain: Medium Risk (02/08/2024)   Received from [facility]   Overall Financial Resource Strain (CARDIA)   . Difficulty of Paying Living Expenses: Somewhat hard  Food Insecurity: Food Insecurity Present (02/08/2024)   Received from [facility]   Hunger Vital Sign   . Worried About Programme researcher, broadcasting/film/video in the Last Year: Sometimes true   . Ran Out of Food in the Last Year: Sometimes true  Transportation Needs: No Transportation Needs (02/08/2024)   Received from [facility]   [facility] - Transportation   . In the past 12 months, has lack of transportation kept you from medical appointments or from getting medications?: No   . Lack of Transportation (Non-Medical): No  Physical Activity: Not on file  Stress: Not on file  Social Connections: Not on file  Intimate Partner Violence: Not on file     Exam Findings  Physical Exam:  Vital Signs:  Temp:  [97.5 F (36.4 C)-98 F (36.7 C)] 98 F (36.7 C) (05/16 2004) Pulse Rate:  [85-97] 85 (05/16 2004) Resp:  [18] 18 (05/16 2004) BP: (140-161)/(90-101) 140/90 (05/16 2004) SpO2:  [95 %-100 %] 95 % (05/16  2004) Blood pressure (!) 140/90, pulse 85, temperature 98 F (36.7 C), temperature source Oral, resp. rate 18, height 4\' 11"  (1.499 m), last menstrual period 10/25/2016, SpO2 95%. Body mass index is 39.99 kg/m.  Physical Exam Vitals and nursing note reviewed.  Constitutional:      Appearance: Normal appearance.  HENT:     Head: Normocephalic and atraumatic.     Nose: Nose normal.     Mouth/Throat:     Mouth: Mucous membranes are dry.  Eyes:     Pupils: Pupils are equal, round, and reactive to light.  Pulmonary:     Effort: Pulmonary effort is normal.  Musculoskeletal:        General: Normal range of motion.     Cervical back: Normal range of motion.  Skin:    General: Skin is dry.  Neurological:  Mental Status: Tiffany Andrade is alert and oriented to person, place, and time.  Psychiatric:        Attention and Perception: Attention and perception normal.        Mood and Affect: Mood is anxious. Affect is labile, angry and inappropriate.        Speech: Speech normal.        Behavior: Behavior is uncooperative, agitated, aggressive and combative.        Thought Content: Thought content includes suicidal ideation. Thought content includes suicidal plan.        Cognition and Memory: Cognition normal.        Judgment: Judgment is impulsive and inappropriate.     Mental Status Exam: General Appearance: Disheveled  Orientation:  Full (Time, Place, and Person)  Memory:  Immediate;   Fair Recent;   Fair  Concentration:  Concentration: Fair and Attention Span: Fair  Recall:  Fair  Attention  Fair  Eye Contact:  Fair  Speech:  Clear and Coherent  Language:  Fair  Volume:  Increased  Mood: angry, labile  Affect:  Congruent  Thought Process:  Coherent  Thought Content:  WDL  Suicidal Thoughts:  Yes.  with intent/plan  Homicidal Thoughts:  No  Judgement:  Impaired  Insight:  Lacking  Psychomotor Activity:  Normal  Akathisia:  NA  Fund of Knowledge:  Fair      Assets:   Solicitor Social Support  Cognition:  WNL  ADL's:  Intact  AIMS (if indicated):        Other History   These have been pulled in through the EMR, reviewed, and updated if appropriate.  Family History:  The patient's family history includes Hypertension in her brother, mother, and sister.  Medical History: Past Medical History:  Diagnosis Date  . Anxiety   . Depression   . DVT (deep venous thrombosis) (HCC)   . Hypertension   . UTI (lower urinary tract infection)     Surgical History: Past Surgical History:  Procedure Laterality Date  . CESAREAN SECTION     x2  . FOOT SURGERY Right   . KIDNEY SURGERY     X2  . TONSILLECTOMY    . TUBAL LIGATION       Medications:   Current Facility-Administered Medications:  .  alum & mag hydroxide-simeth (MAALOX/MYLANTA) 200-200-20 MG/5ML suspension 30 mL, 30 mL, Oral, Q6H PRN, Jacquie Maudlin, MD .  ibuprofen  (ADVIL ) tablet 600 mg, 600 mg, Oral, Q8H PRN, Jacquie Maudlin, MD .  Cecily Cohen ON 03/17/2024] metoprolol  succinate (TOPROL -XL) 24 hr tablet 25 mg, 25 mg, Oral, Daily, Jacquie Maudlin, MD .  ondansetron  (ZOFRAN ) tablet 4 mg, 4 mg, Oral, Q8H PRN, Jacquie Maudlin, MD .  Cecily Cohen ON 03/17/2024] spironolactone  (ALDACTONE ) tablet 100 mg, 100 mg, Oral, Daily, Jacquie Maudlin, MD  Current Outpatient Medications:  .  albuterol  (VENTOLIN  HFA) 108 (90 Base) MCG/ACT inhaler, Inhale 2 puffs into the lungs every 4 (four) hours as needed., Disp: 18 g, Rfl: 0 .  amLODipine (NORVASC) 5 MG tablet, Take by mouth., Disp: , Rfl:  .  B Complex-C-Calcium  (GNP B-COMPLEX PLUS VITAMIN C) TABS, Take by mouth., Disp: , Rfl:  .  beclomethasone (QVAR REDIHALER) 40 MCG/ACT inhaler, Inhale into the lungs., Disp: , Rfl:  .  benzonatate  (TESSALON ) 100 MG capsule, Take 1 capsule (100 mg total) by mouth every 8 (eight) hours., Disp: 21 capsule, Rfl: 0 .  busPIRone (BUSPAR) 10 MG tablet, Take 5 mg by mouth 2  (  two) times daily., Disp: , Rfl:  .  candesartan (ATACAND) 32 MG tablet, Take by mouth., Disp: , Rfl:  .  cetirizine (ZYRTEC) 10 MG tablet, Take 10 mg by mouth daily., Disp: , Rfl:  .  chlorthalidone (HYGROTON) 25 MG tablet, Take 25 mg by mouth daily., Disp: , Rfl:  .  cyclobenzaprine (FLEXERIL) 10 MG tablet, Take 10 mg by mouth at bedtime as needed., Disp: , Rfl:  .  DULoxetine (CYMBALTA) 60 MG capsule, Take 60 mg by mouth daily., Disp: , Rfl:  .  fluticasone (FLONASE) 50 MCG/ACT nasal spray, Place 1 spray into both nostrils daily., Disp: , Rfl:  .  Galcanezumab-gnlm 120 MG/ML SOAJ, Inject into the skin., Disp: , Rfl:  .  Galcanezumab-gnlm 120 MG/ML SOSY, Inject into the skin., Disp: , Rfl:  .  hydrOXYzine (VISTARIL) 25 MG capsule, Take 25-50 mg by mouth 2 (two) times daily as needed., Disp: , Rfl:  .  lidocaine  (LIDODERM ) 5 %, SMARTSIG:Patch(s) Topical, Disp: , Rfl:  .  MAGNESIUM PO, Take by mouth., Disp: , Rfl:  .  meclizine  (MEDI-MECLIZINE ) 25 MG tablet, Take 1 tablet (25 mg total) by mouth 3 (three) times daily as needed for dizziness., Disp: 30 tablet, Rfl: 0 .  meloxicam  (MOBIC ) 15 MG tablet, Take by mouth., Disp: , Rfl:  .  methocarbamol (ROBAXIN) 500 MG tablet, Take 500 mg by mouth 2 (two) times daily as needed., Disp: , Rfl:  .  mometasone-formoterol (DULERA) 100-5 MCG/ACT AERO, INHALE 2 PUFFS AS DIRECTED TWICE A DAY, Disp: , Rfl:  .  mupirocin  nasal ointment (BACTROBAN ) 2 %, Apply to wound 3 times a day., Disp: 22 g, Rfl: 0 .  nystatin ointment (MYCOSTATIN), Apply topically., Disp: , Rfl:  .  Olopatadine HCl 0.2 % SOLN, Apply 1 drop to eye daily., Disp: , Rfl:  .  ondansetron  (ZOFRAN -ODT) 4 MG disintegrating tablet, Take 1 tablet (4 mg total) by mouth every 8 (eight) hours as needed for nausea or vomiting., Disp: 15 tablet, Rfl: 0 .  OZEMPIC, 0.25 OR 0.5 MG/DOSE, 2 MG/1.5ML SOPN, Inject into the skin., Disp: , Rfl:  .  polyethylene glycol powder (GLYCOLAX/MIRALAX) 17 GM/SCOOP powder,  Take by mouth., Disp: , Rfl:  .  POTASSIUM PO, Take by mouth., Disp: , Rfl:  .  promethazine -dextromethorphan (PROMETHAZINE -DM) 6.25-15 MG/5ML syrup, Take 5 mLs by mouth 4 (four) times daily as needed for cough., Disp: 118 mL, Rfl: 0 .  rizatriptan (MAXALT-MLT) 10 MG disintegrating tablet, Give one tablet may repeat in 2 hours if needed.  No more than 4 in 1 week, Disp: , Rfl:  .  senna (SENOKOT) 8.6 MG tablet, Take 2 tablets by mouth at bedtime., Disp: , Rfl:  .  sertraline (ZOLOFT) 100 MG tablet, Take 1 tablet by mouth daily., Disp: , Rfl:  .  sertraline (ZOLOFT) 50 MG tablet, PLEASE SEE ATTACHED FOR DETAILED DIRECTIONS, Disp: , Rfl:  .  solifenacin (VESICARE) 5 MG tablet, Take 1 tablet by mouth daily., Disp: , Rfl:  .  Spacer/Aero-Holding Chambers (AEROCHAMBER MV) inhaler, Use as instructed, Disp: 1 each, Rfl: 2 .  spironolactone  (ALDACTONE ) 50 MG tablet, Take 50 mg by mouth daily., Disp: , Rfl:  .  tiZANidine (ZANAFLEX) 4 MG tablet, Take 2-4 mg by mouth 2 (two) times daily as needed., Disp: , Rfl:  .  topiramate (TOPAMAX) 25 MG tablet, Take 25 mg by mouth daily., Disp: , Rfl:  .  traMADol (ULTRAM) 50 MG tablet, Take by mouth., Disp: , Rfl:  .  valACYclovir  (VALTREX ) 1000 MG tablet, Take 1 tablet (1,000 mg total) by mouth 3 (three) times daily., Disp: 21 tablet, Rfl: 0  Allergies: Allergies  Allergen Reactions  . Levofloxacin  Hives    Other reaction(s): Other (See Comments)  . Oxybutynin Hives and Swelling    Other reaction(s): Other (See Comments)  . Codeine Other (See Comments)    Patient states Tiffany Andrade gets real bad headaches. Other reaction(s): Other (See Comments) Patient states Tiffany Andrade gets real bad headaches. HA  . Iodinated Contrast Media Hives and Other (See Comments)    Patient states her tongue starts tingling. Other reaction(s): Other (See Comments), Unknown Blood pressure gets low, itchy nose and tongue. Blood pressure gets low, itchy nose and tongue.   . Other Hives     Other reaction(s): Other (See Comments) Patient states her tongue starts tingling.  Aaron Aas Penicillins Hives    Has patient had a PCN reaction causing immediate rash, facial/tongue/throat swelling, SOB or lightheadedness with hypotension: No Has patient had a PCN reaction causing severe rash involving mucus membranes or skin necrosis: No Has patient had a PCN reaction that required hospitalization No Has patient had a PCN reaction occurring within the last 10 years: Yes If all of the above answers are "NO", then may proceed with Cephalosporin use. Other reaction(s): Other (See Comments)  . Buprenorphine Nausea And Vomiting and Other (See Comments)    Hot flashes  . Sulfa  Antibiotics Other (See Comments)    Patient states this medication does not work for her, have to take stronger dose for a longer period of time.  Other reaction(s): Other (See Comments)  Patient states Tiffany Andrade is immune to it.  Says Tiffany Andrade has to take double the amount for twice as long for it to work.    Patient states this medication does not work for her, have to take stronger dose for a longer period of time.  Patient states Tiffany Andrade is immune to it.  Says Tiffany Andrade has to take double the amount for twice as long for it to work.    Patient states this medication does not work for her, have to take stronger dose for a longer period of time.    Patient states this medication does not work for her, have to take stronger dose for a longer period of time.  Other reaction(s): Other (See Comments)  Patient states Tiffany Andrade is immune to it.  Says Tiffany Andrade has to take double the amount for twice as long for it to work.  Patient states this medication does not work for her, have to take stronger dose for a longer period of time.  . Tape Itching and Other (See Comments)    blisters  Other reaction(s): Other (See Comments)    Al Alias, NP

## 2024-03-16 NOTE — ED Notes (Addendum)
 Pt is requesting "photographs of my bruising that the police caused as they brought me here against my will". [place] to bedside to assess her "bruises" on her wrist.

## 2024-03-16 NOTE — ED Triage Notes (Signed)
 Patient brought in by [facility] for suicidal concerns voiced at her [place] [facility]. Patient verified she is suicidal.  Patient upset she was not taken to [facility] and refusing to allow staff to get vitals or complete tasks.   Patient reports her plan was to overdose on pills

## 2024-03-17 DIAGNOSIS — R45851 Suicidal ideations: Secondary | ICD-10-CM

## 2024-03-17 MED ORDER — POTASSIUM CHLORIDE CRYS ER 20 MEQ PO TBCR
40.0000 meq | EXTENDED_RELEASE_TABLET | Freq: Every day | ORAL | Status: DC
  Filled 2024-03-17: qty 2

## 2024-03-17 NOTE — ED Notes (Signed)
 Pt denies SI/HI/AVH on assessment. Pt fixated with having her psychiatrist at [facility] treat her and not "our doctors". Informed that she is going for inpatient admission but she states she can only be held for 72hrs. Refuses to listen to any other explanation.

## 2024-03-17 NOTE — ED Notes (Signed)
 PATIENT BED AVAILABLE AT 9AM ON 03/17/24 Patient has been accepted to [facility].  Patient assigned to [facility] Accepting physician is NP Rhae Cease.  Call report to [phone].  Address: [address] [place]

## 2024-03-17 NOTE — ED Provider Notes (Addendum)
 Emergency Medicine Observation Re-evaluation Note  Tiffany Andrade is a 48 y.o. female, seen on rounds today.  Pt initially presented to the ED for complaints of Suicidal Currently, the patient is pacing back-and-forth in the room but in no distress, appears anxious.  Physical Exam  BP (!) 140/90 (BP Location: Left Arm)   Pulse 85   Temp 98 F (36.7 C) (Oral)   Resp 18   Ht 4\' 11"  (1.499 m)   LMP 10/25/2016   SpO2 95%   BMI 39.99 kg/m  Physical Exam General: Anxious appearance, pacing back-and-forth Lungs: No distress   ED Course / MDM  EKG:   I have reviewed the labs performed to date as well as medications administered while in observation.  Recent changes in the last 24 hours include plan for transfer to [facility].  Plan  Current plan is for transfer for inpatient psychiatric care.  Patient remains under involuntary commitment    Iver Marker, MD 03/17/24 0740   Nursing staff advised patient refusing to take her metoprolol .  At this point I do not see evidence to support need that missing her metoprolol  dosing will lead to immediate life-threatening pathology.    Iver Marker, MD 03/17/24 [phone]

## 2024-03-17 NOTE — ED Notes (Signed)
 Attempted to call report, advised nurse still in report so no one available to take it. Informed to call in about .

## 2024-03-17 NOTE — ED Notes (Addendum)
 Pt stating she will not sleep without her cpap machine and zepbound injection. PT told per MD we do not do zepbound injections here in the ED and with pt's suicidal thoughts  having a cpap machine is to great of a danger to her. PT still being non compliant with prescribed medications and procedures.

## 2024-03-17 NOTE — ED Notes (Signed)
 This RN introduced self to pt. PT short with answers and non compliant with some aspects of her care. Pt refused EKG. Provider made aware.

## 2024-03-17 NOTE — ED Notes (Signed)
 Report given to Kaari, [place]

## 2024-03-17 NOTE — ED Notes (Signed)
Pt given sandwich tray and beverage. 

## 2024-03-17 NOTE — ED Notes (Signed)
 Hospital meal provided, pt tolerated w/o complaints.  Waste discarded appropriately.

## 2024-03-17 NOTE — BH Assessment (Signed)
 Per [facility] Ardelia Beau), patient to be referred out of system.  Referral information for Inpatient Hospitalization Placement have been faxed to;   [facility] Patient Placement [phone]  [facility] [phone]  [place] [phone]  [facility] [phone]  [facility] [phone]  [facility] [phone]  [facility] [phone]  [place] EFAX [phone]  [facility] [phone]  [facility] [phone]

## 2024-03-17 NOTE — ED Notes (Signed)
 PT given warm blankets and warm pillow.

## 2024-03-17 NOTE — ED Provider Notes (Signed)
 Vitals:   03/16/24 2004 03/17/24 1101  BP: (!) 140/90 (!) 161/103  Pulse: 85 82  Resp: 18 19  Temp: 98 F (36.7 C) 97.8 F (36.6 C)  SpO2: 95% 99%    Calm. Patient understanding of transfer. She is under IVC and going to [facility]. She is alert, oriented. No distress. Has insurance questions, recommended she contact her insurer once able though I can't speak to specifics.  She is alert, oriented, non-agitated.    Iver Marker, MD 03/17/24 1401

## 2024-03-17 NOTE — BH Assessment (Signed)
 PATIENT BED AVAILABLE AT 9AM ON 03/17/24  Patient has been accepted to [facility].  Patient assigned to [facility] Accepting physician is NP Rhae Cease.  Call report to [phone].  Representative was Intake [place]ash[place].   ER Staff is aware of it:  [facility] ER Secretary  Dr. Synetta Eves, ER MD  [facility] Patient's Nurse  Address: [address] [place], [place]

## 2024-03-17 NOTE — ED Notes (Signed)
Pt pacing in room.

## 2024-03-17 NOTE — ED Notes (Signed)
 Pt going to [facility]. Aware of transfer and verbalizes understanding of the same. Agrees with POC at this time. Given all her personal belongings. Stable, ambulatory and in NAD. Picked up by [employer].

## 2024-06-14 ENCOUNTER — Ambulatory Visit (INDEPENDENT_AMBULATORY_CARE_PROVIDER_SITE_OTHER)

## 2024-06-14 ENCOUNTER — Ambulatory Visit
Admission: EM | Admit: 2024-06-14 | Discharge: 2024-06-14 | Disposition: A | Discharge status: Home / Self Care | Attending: Emergency Medicine | Admitting: Emergency Medicine

## 2024-06-14 DIAGNOSIS — R109 Unspecified abdominal pain: Secondary | ICD-10-CM | POA: Insufficient documentation

## 2024-06-14 DIAGNOSIS — N10 Acute tubulo-interstitial nephritis: Secondary | ICD-10-CM | POA: Insufficient documentation

## 2024-06-14 LAB — URINALYSIS, W/ REFLEX TO CULTURE (INFECTION SUSPECTED)
Bilirubin Urine: NEGATIVE
Glucose, UA: NEGATIVE mg/dL
Ketones, ur: NEGATIVE mg/dL
Nitrite: NEGATIVE
Protein, ur: 100 mg/dL — AB
Specific Gravity, Urine: 1.015 (ref 1.005–1.030)
WBC, UA: >50 WBC/hpf (ref 0–5)
pH: 6.5 (ref 5.0–8.0)

## 2024-06-14 MED ORDER — PHENAZOPYRIDINE HCL 200 MG PO TABS: 200.0000 mg | ORAL_TABLET | Freq: Three times a day (TID) | ORAL | 0 refills | Status: AC

## 2024-06-14 MED ORDER — CEPHALEXIN 500 MG PO CAPS: 500.0000 mg | ORAL_CAPSULE | Freq: Three times a day (TID) | ORAL | 0 refills | Status: AC

## 2024-06-14 NOTE — Discharge Instructions (Addendum)
 Take the Keflex  3 times daily for 10 days with food for treatment of pyelonephritis.  Use the Pyridium  every 8 hours as needed for urinary discomfort.  This will turn your urine a bright red-orange.  Increase your oral fluid intake so that you increase your urine production and or flushing your urinary system.  Take an over-the-counter probiotic, such as Culturelle-Align-Activia, 1 hour after each dose of antibiotic to prevent diarrhea or yeast infections from forming.  We will culture urine and change the antibiotics if necessary.  Return for reevaluation, or see your primary care provider, for any new or worsening symptoms.

## 2024-06-14 NOTE — ED Triage Notes (Signed)
 Pt c/o abd cramping & lower back pain that started this AM. Has tried tylenol  w/o relief.

## 2024-06-14 NOTE — ED Provider Notes (Addendum)
 [facility]    CSN: [id number] Arrival date & time: 06/14/24  1303      History   Chief Complaint Chief Complaint  Patient presents with   Abdominal Cramping   Back Pain    HPI Tiffany Andrade is a 48 y.o. female.   HPI  48 year old female with past medical history significant for UTI, hypertension, DVT, depression, and anxiety presents for evaluation of right-sided low back pain and suprapubic abdominal cramping that started last night.  She reports that it feels similar to when she has had UTIs in the past though it is on the right side instead of the left.  She has had pain with urination along with urinary urgency and frequency.  Possible nausea.  She denies any fever, vomiting, or blood in her urine.  No vaginal symptoms.  History of kidney stones.  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/22/2020   Other chronic pain 02/15/2020   Chest pain 05/25/2018   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   Right leg pain 04/14/2018   Migraine without aura and without status migrainosus, not intractable 06/03/2017   Chronic cystitis 04/06/2017   Hematuria, microscopic 04/06/2017   Incomplete emptying of bladder 04/06/2017   Ureteral mass 04/06/2017   Unilateral ureteral reflux 04/06/2017   Right arm pain 11/12/2013   Anemia 09/25/2013   Constipation 09/25/2013   Dysmenorrhea 09/25/2013   Menorrhagia 09/25/2013   Hematochezia 05/08/2013   Obstructive sleep  apnea 12/26/2012   Essential (primary) hypertension 10/19/2012   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      Home Medications    Prior to Admission medications   Medication Sig Start Date End Date Taking? Authorizing Provider  cephALEXin  (KEFLEX ) 500 MG capsule Take 1 capsule (500 mg total) by mouth 3 (three) times daily for 10 days. 06/14/24 06/24/24 Yes Bernardino Ditch, NP  phenazopyridine  (PYRIDIUM ) 200 MG tablet Take 1 tablet (200 mg total) by mouth 3 (three) times daily. 06/14/24  Yes Bernardino Ditch, NP  propranolol (INDERAL) 10 MG tablet TAKE 1 TABLET BY MOUTH DAILY AS NEEDED (ANXIETY). 04/30/24  Yes [provider]  venlafaxine XR (EFFEXOR-XR) 37.5 MG 24 hr capsule Take by mouth. 05/24/24 07/07/24 Yes [provider]  albuterol  (VENTOLIN  HFA) 108 (90 Base) MCG/ACT inhaler Inhale 2 puffs into the lungs every 4 (four) hours as needed. 10/30/22   Bernardino Ditch, NP  amLODipine (NORVASC) 5 MG tablet Take by mouth. Patient not taking: Reported on 03/17/2024 09/26/18   [provider]  B Complex-C-Calcium  (GNP B-COMPLEX PLUS VITAMIN C) TABS Take by  mouth.    [provider]  beclomethasone (QVAR REDIHALER) 40 MCG/ACT inhaler Inhale into the lungs. Patient not taking: Reported on 03/17/2024 01/18/21   [provider]  benzonatate  (TESSALON ) 100 MG capsule Take 1 capsule (100 mg total) by mouth every 8 (eight) hours. Patient not taking: Reported on 03/17/2024 10/28/22   Teresa Shelba SAUNDERS, NP  busPIRone (BUSPAR) 10 MG tablet Take 5 mg by mouth 2 (two) times daily. Patient not taking: Reported on 03/17/2024 11/09/19   [provider]  candesartan (ATACAND) 32 MG  tablet Take 32 mg by mouth daily. 12/26/20   [provider]  cetirizine (ZYRTEC) 10 MG tablet Take 10 mg by mouth daily. 03/19/21   [provider]  chlorthalidone (HYGROTON) 25 MG tablet Take 25 mg by mouth daily.    [provider]  cyclobenzaprine (FLEXERIL) 10 MG tablet Take 10 mg by mouth at bedtime as needed. Patient not taking: Reported on 03/17/2024 12/23/19   [provider]  DULoxetine (CYMBALTA) 60 MG capsule Take 60 mg by mouth at bedtime.    [provider]  fluticasone (FLONASE) 50 MCG/ACT nasal spray Place 1 spray into both nostrils daily. 05/20/20   [provider]  fosfomycin (MONUROL) 3 g PACK Take 3 g by mouth every 7 (seven) days. Patient not taking: Reported on 03/17/2024    [provider]  Galcanezumab-gnlm 120 MG/ML SOAJ Inject into the skin. 12/29/21   [provider]  hydrOXYzine (ATARAX) 25 MG tablet Take 25-50 mg by mouth 3 (three) times daily as needed. Patient not taking: Reported on 03/17/2024    [provider]  hydrOXYzine (VISTARIL) 25 MG capsule Take 25-50 mg by mouth 2 (two) times daily as needed. Patient not taking: Reported on 03/17/2024 01/23/21   [provider]  ipratropium (ATROVENT) 0.06 % nasal spray Place 2 sprays into both nostrils in the morning. 11/30/23   [provider]  lidocaine  (LIDODERM ) 5 % SMARTSIG:Patch(s) Topical Patient not taking: Reported on 03/17/2024 03/06/21   [provider]  MAGNESIUM PO Take by mouth.    [provider]  meclizine  (MEDI-MECLIZINE ) 25 MG tablet Take 1 tablet (25 mg total) by mouth 3 (three) times daily as needed for dizziness. Patient not taking: Reported on 03/17/2024 08/18/22   Menshew, Candida LULLA Kings, PA-C  meloxicam  (MOBIC ) 15 MG tablet Take by mouth. Patient not taking: Reported on 03/17/2024 09/14/19   [provider]  methocarbamol (ROBAXIN) 500 MG tablet Take 500 mg by mouth 2 (two) times daily  as needed. Patient not taking: Reported on 03/17/2024 01/31/22   [provider]  mometasone-formoterol (DULERA) 100-5 MCG/ACT AERO INHALE 2 PUFFS AS DIRECTED TWICE A DAY Patient not taking: Reported on 03/17/2024 11/15/22   [provider]  mupirocin  nasal ointment (BACTROBAN ) 2 % Apply to wound 3 times a day. Patient not taking: Reported on 03/17/2024 09/20/20   Bernardino Ditch, NP  nystatin ointment (MYCOSTATIN) Apply topically. Patient not taking: Reported on 03/17/2024    [provider]  Olopatadine HCl 0.2 % SOLN Apply 1 drop to eye daily. Patient not taking: Reported on 03/17/2024 05/12/20   [provider]  ondansetron  (ZOFRAN -ODT) 4 MG disintegrating tablet Take 1 tablet (4 mg total) by mouth every 8 (eight) hours as needed for nausea or vomiting. Patient not taking: Reported on 03/17/2024 08/18/22   Menshew, Candida LULLA Kings, PA-C  OZEMPIC, 0.25 OR 0.5 MG/DOSE, 2 MG/1.5ML SOPN Inject into the skin. Patient not taking: Reported on  03/17/2024 05/20/21   [provider]  polyethylene glycol powder (GLYCOLAX/MIRALAX) 17 GM/SCOOP powder Take by mouth.    [provider]  POTASSIUM PO Take by mouth.    [provider]  promethazine -dextromethorphan (PROMETHAZINE -DM) 6.25-15 MG/5ML syrup Take 5 mLs by mouth 4 (four) times daily as needed for cough. Patient not taking: Reported on 03/17/2024 10/28/22   Teresa Shelba SAUNDERS, NP  rizatriptan (MAXALT-MLT) 10 MG disintegrating tablet Give one tablet may repeat in 2 hours if needed.  No more than 4 in 1 week Patient not taking: Reported on 03/17/2024 04/14/18   [provider]  senna (SENOKOT) 8.6 MG tablet Take 2 tablets by mouth at bedtime.    [provider]  sertraline (ZOLOFT) 100 MG tablet Take 1 tablet by mouth daily. Patient not taking: Reported on 03/17/2024 03/20/21   [provider]  sertraline (ZOLOFT) 50 MG tablet PLEASE SEE ATTACHED FOR DETAILED DIRECTIONS Patient  not taking: Reported on 03/17/2024 09/11/21   [provider]  solifenacin (VESICARE) 5 MG tablet Take 1 tablet by mouth daily. Patient not taking: Reported on 03/17/2024 01/14/22   [provider]  Spacer/Aero-Holding Chambers (AEROCHAMBER MV) inhaler Use as instructed 10/30/22   Bernardino Ditch, NP  spironolactone  (ALDACTONE ) 100 MG tablet Take 100 mg by mouth daily. 01/27/24   [provider]  spironolactone  (ALDACTONE ) 50 MG tablet Take 50 mg by mouth daily. Patient not taking: Reported on 03/17/2024    [provider]  tiZANidine (ZANAFLEX) 4 MG tablet Take 2-4 mg by mouth 2 (two) times daily as needed. Patient not taking: Reported on 03/17/2024    [provider]  topiramate (TOPAMAX) 25 MG tablet Take 25 mg by mouth daily. Patient not taking: Reported on 03/17/2024 10/07/23   [provider]  traMADol (ULTRAM) 50 MG tablet Take by mouth. Patient not taking: Reported on 03/17/2024 06/06/23   [provider]  valACYclovir  (VALTREX ) 1000 MG tablet Take 1 tablet (1,000 mg total) by mouth 3 (three) times daily. Patient not taking: Reported on 03/17/2024 05/09/22   Arvis Huxley B, PA-C  ZEPBOUND 7.5 MG/0.5ML Pen Inject 7.5 mg into the skin once a week. 02/10/24   [provider]  amitriptyline (ELAVIL) 25 MG tablet Take 25 mg by mouth at bedtime as needed for sleep.  11/14/19  [provider]  lisinopril-hydrochlorothiazide (PRINZIDE,ZESTORETIC) 20-25 MG tablet Take 1 tablet by mouth daily.  02/02/21  [provider]    Family History Family History  Problem Relation Age of Onset   Hypertension Mother    Hypertension Sister    Hypertension Brother     Social History Social History   Tobacco Use   Smoking status: Never   Smokeless tobacco: Never  Vaping Use   Vaping status: Never Used  Substance Use Topics   Alcohol use: No    Alcohol/week: 0.0 standard drinks of alcohol   Drug use: No     Allergies    Iodinated contrast media, Levofloxacin , Oxybutynin, Penicillins, Other, Buprenorphine, Codeine, Sulfa  antibiotics, and Tape   Review of Systems Review of Systems  Constitutional:  Negative for fever.  Gastrointestinal:  Positive for abdominal pain and nausea. Negative for vomiting.  Genitourinary:  Positive for dysuria, frequency and urgency. Negative for hematuria, vaginal discharge and vaginal pain.  Musculoskeletal:  Positive for back pain.     Physical Exam Triage Vital Signs ED Triage Vitals  Encounter Vitals Group     BP      Girls Systolic  BP Percentile      Girls Diastolic BP Percentile      Boys Systolic BP Percentile      Boys Diastolic BP Percentile      Pulse      Resp      Temp      Temp src      SpO2      Weight      Height      Head Circumference      Peak Flow      Pain Score      Pain Loc      Pain Education      Exclude from Growth Chart    No data found.  Updated Vital Signs BP (!) 153/89 (BP Location: Left Arm)   Pulse 62   Temp 97.6 F (36.4 C) (Oral)   Resp 16   Ht 4' 10 (1.473 m)   Wt 168 lb (76.2 kg)   LMP 10/25/2016   SpO2 100%   BMI 35.11 kg/m   Visual Acuity Right Eye Distance:   Left Eye Distance:   Bilateral Distance:    Right Eye Near:   Left Eye Near:    Bilateral Near:     Physical Exam Vitals and nursing note reviewed.  Constitutional:      General: She is in acute distress.     Appearance: Normal appearance.     Comments: Patient appears to be in the moderate degree of discomfort.  Cardiovascular:     Rate and Rhythm: Normal rate and regular rhythm.     Pulses: Normal pulses.     Heart sounds: Normal heart sounds. No murmur heard.    No friction rub. No gallop.  Pulmonary:     Effort: Pulmonary effort is normal.     Breath sounds: Normal breath sounds. No wheezing, rhonchi or rales.  Abdominal:     Palpations: Abdomen is soft.     Tenderness: There is abdominal tenderness. There is right CVA tenderness.  There is no left CVA tenderness, guarding or rebound.  Skin:    General: Skin is warm and dry.     Capillary Refill: Capillary refill takes less than 2 seconds.     Findings: No rash.  Neurological:     General: No focal deficit present.     Mental Status: She is alert and oriented to person, place, and time.      UC Treatments / Results  Labs (all labs ordered are listed, but only abnormal results are displayed) Labs Reviewed  URINALYSIS, W/ REFLEX TO CULTURE (INFECTION SUSPECTED) - Abnormal; Notable for the following components:      Result Value   Color, Urine STRAW (*)    APPearance HAZY (*)    Hgb urine dipstick LARGE (*)    Protein, ur 100 (*)    Leukocytes,Ua MODERATE (*)    Bacteria, UA MANY (*)    All other components within normal limits  URINE CULTURE    EKG   Radiology No results found.  Procedures Procedures (including critical care time)  Medications Ordered in UC Medications - No data to display  Initial Impression / Assessment and Plan / UC Course  I have reviewed the triage vital signs and the nursing notes.  Pertinent labs & imaging results that were available during my care of the patient were reviewed by me and considered in my medical decision making (see chart for details).   Patient is a nontoxic-appearing 48 year old female presenting for  evaluation of right-sided low back pain as outlined in HPI above.  The patient has a history of UTIs as well as a history of microscopic hematuria and incomplete emptying of the bladder with unilateral ureteral reflux.  She reports that typically she has pain on the left side and this is different because it is on the right.  It does wrap around to the right lower aspect of her abdomen to the suprapubic region.  She is moving very gingerly and she does have positive CVA tenderness on the right.  She also has tenderness with palpation of the right flank and into the right lower quadrant and suprapubically.  No  tenderness over McBurney's point.  She has a dysuria with urgency and frequency but no hematuria.  She does not have a history of kidney stones.  I will obtain a urinalysis to assess the presence of UTI.  If there is significant blood in the patient's urine I will also obtain a KUB to evaluate for possible renal stone.  Urinalysis was strong color with a hazy appearance, large hemoglobin, 100 protein, moderate leukocyte esterase.  Greater than 50 WBCs and 21-50 RBCs.  Negative for nitrites.  Many bacteria present.  Urine will reflex to culture.  Given the large volume of blood I will obtain a KUB to rule out the presence of renal stone.  KUB independently reviewed and evaluated by me.  Impression: No evidence of radiopaque calculi in.  Patient does have scattered stool throughout the colon.  Nonobstructive bowel gas pattern.  Radiology overread is pending.   I will discharge patient with a diagnosis of urinary tract infection and.  Patient has high allergies to fluoroquinolones and penicillins.  She has an intolerance to sulfa  medication as she reports that it takes higher doses and longer duration of therapy to be effective.  Therefore, I will discharge her home on fosfomycin 3 g as a single dose for treatment of her UTI.  I will also prescribe Pyridium  that she can use every 8 hours as needed for urinary discomfort.  Patient's last 3 urine cultures grew out E. coli.  Review of records in epic indicate that patient had pyelonephritis in December 2024 and was treated with a round of Keflex .  I will discharge her home on Keflex  500 mg 3 times daily x 10 days along with Pyridium  to help with the urinary discomfort.   Final Clinical Impressions(s) / UC Diagnoses   Final diagnoses:  Acute right flank pain  Acute pyelonephritis     Discharge Instructions      Take the Keflex  3 times daily for 10 days with food for treatment of pyelonephritis.  Use the Pyridium  every 8 hours as needed for  urinary discomfort.  This will turn your urine a bright red-orange.  Increase your oral fluid intake so that you increase your urine production and or flushing your urinary system.  Take an over-the-counter probiotic, such as Culturelle-Align-Activia, 1 hour after each dose of antibiotic to prevent diarrhea or yeast infections from forming.  We will culture urine and change the antibiotics if necessary.  Return for reevaluation, or see your primary care provider, for any new or worsening symptoms.      ED Prescriptions     Medication Sig Dispense Auth. Provider   cephALEXin  (KEFLEX ) 500 MG capsule Take 1 capsule (500 mg total) by mouth 3 (three) times daily for 10 days. 30 capsule Bernardino Ditch, NP   phenazopyridine  (PYRIDIUM ) 200 MG tablet Take 1 tablet (200  mg total) by mouth 3 (three) times daily. 6 tablet Bernardino Ditch, NP      PDMP not reviewed this encounter.   Bernardino Ditch, NP 06/14/24 1444    Bernardino Ditch, NP 06/14/24 1526

## 2024-06-17 LAB — URINE CULTURE: Culture: >=100000 — AB

## 2024-06-18 ENCOUNTER — Ambulatory Visit (HOSPITAL_COMMUNITY): Payer: Self-pay

## 2024-07-21 ENCOUNTER — Encounter: Payer: Self-pay | Admitting: Emergency Medicine

## 2024-07-21 ENCOUNTER — Ambulatory Visit
Admission: EM | Admit: 2024-07-21 | Discharge: 2024-07-21 | Disposition: A | Discharge status: Home / Self Care | Attending: Physician Assistant | Admitting: Physician Assistant

## 2024-07-21 DIAGNOSIS — U071 COVID-19: Secondary | ICD-10-CM | POA: Diagnosis present

## 2024-07-21 DIAGNOSIS — R52 Pain, unspecified: Secondary | ICD-10-CM | POA: Diagnosis present

## 2024-07-21 LAB — RESP PANEL BY RT-PCR (FLU A&B, COVID) ARPGX2
Influenza A by PCR: NEGATIVE
Influenza B by PCR: NEGATIVE
SARS Coronavirus 2 by RT PCR: POSITIVE — AB

## 2024-07-21 LAB — GROUP A STREP BY PCR: Group A Strep by PCR: NOT DETECTED

## 2024-07-21 MED ORDER — PAXLOVID (300/100) 20 X 150 MG & 10 X 100MG PO TBPK: 3.0000 | ORAL_TABLET | Freq: Two times a day (BID) | ORAL | 0 refills | Status: DC

## 2024-07-21 MED ORDER — PROMETHAZINE-DM 6.25-15 MG/5ML PO SYRP: 5.0000 mL | ORAL_SOLUTION | Freq: Two times a day (BID) | ORAL | 0 refills | Status: AC | PRN

## 2024-07-21 NOTE — ED Triage Notes (Signed)
 Patient c/o bodyaches, sore throat, headache, and cough that started yesterday.  Patient reports fever 102 this morning.  Patient took Tylenol  this morning at home.

## 2024-07-21 NOTE — Discharge Instructions (Signed)
 You tested positive for COVID.  Because of your history of asthma, we are going to start Paxlovid .  Take this twice a day for 5 days.  This can cause an upset stomach and abnormal taste in your mouth.  While you are on this medication please do not take your hydroxyzine or cyclobenzaprine for the 5 days you are taking this medicine and for 3 days after (a total of 8 days).  Use over-the-counter medicine such as Mucinex, Flonase, Tylenol .  I have called in Promethazine  DM for cough.  This will make you sleepy so do not drive or drink alcohol with taking it.  If you are not feeling better within 3 to 5 days please return for reevaluation.  If anything worsens and you have high fever, worsening cough, shortness of breath, chest pain, nausea/vomiting interfere with oral intake, weakness you need to be seen immediately.  I do recommend you obtain a pulse oximeter and monitor your oxygen saturation.  If this drops below 93% consistently please return here and if below 90% go to the emergency room.

## 2024-07-21 NOTE — ED Provider Notes (Signed)
 [facility]    CSN: [id number] Arrival date & time: 07/21/24  9081      History   Chief Complaint Chief Complaint  Patient presents with   Generalized Body Aches   Headache   Sore Throat    HPI Tiffany Andrade is a 48 y.o. female.   Patient presents today with a 2-day history of URI symptoms.  She reports body aches, sore throat, cough, congestion, headache, fever with Tmax of 102 F earlier today.  She denies any chest pain, shortness of breath, nausea, vomiting, diarrhea.  She took Tylenol  this morning and has been using cold and flu medication with minimal improvement of symptoms.  She denies any known sick contacts.  She does have a history of asthma and has been compliant with her Qvar maintenance medication.  She has not needed her albuterol  inhaler since her symptoms began.  She denies any recent steroids in the past 90 days.  She has had cephalexin  about 30 days ago for UTI.  Denies additional antibiotics in the past 90 days.  She has had COVID with her last episode about 2 years ago.  She has had COVID-19 vaccination.  No concern for pregnancy as she is status post hysterectomy.  She is having difficulty with daily duties as a result of her symptoms.    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/22/2020   Other chronic pain 02/15/2020   Chest pain 05/25/2018   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   Right leg pain 04/14/2018   Migraine without aura  and without status migrainosus, not intractable 06/03/2017   Chronic cystitis 04/06/2017   Hematuria, microscopic 04/06/2017   Incomplete emptying of bladder 04/06/2017   Ureteral mass 04/06/2017   Unilateral ureteral reflux 04/06/2017   Right arm pain 11/12/2013   Anemia 09/25/2013   Constipation 09/25/2013   Dysmenorrhea 09/25/2013   Menorrhagia 09/25/2013   Hematochezia 05/08/2013   Obstructive sleep apnea 12/26/2012   Essential (primary) hypertension 10/19/2012   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      Home Medications    Prior to Admission medications   Medication Sig Start Date End Date Taking? Authorizing Provider  B Complex-C-Calcium  (GNP B-COMPLEX PLUS VITAMIN C) TABS Take by mouth.   Yes [provider]  beclomethasone (QVAR REDIHALER) 40 MCG/ACT inhaler Inhale into the lungs. 01/18/21  Yes [provider]  candesartan (ATACAND) 32 MG tablet Take 32 mg by mouth daily. 12/26/20  Yes [provider]  chlorthalidone (HYGROTON) 25 MG tablet Take 25 mg by mouth daily.   Yes  [provider]  hydrOXYzine (ATARAX) 25 MG tablet Take 25-50 mg by mouth 3 (three) times daily as needed.   Yes [provider]  MAGNESIUM PO Take by mouth.   Yes [provider]  meloxicam  (MOBIC ) 15 MG tablet Take by mouth. 09/14/19  Yes [provider]  methocarbamol (ROBAXIN) 500 MG tablet Take 500 mg by mouth 2 (two) times daily as needed. 01/31/22  Yes [provider]  nirmatrelvir/ritonavir (PAXLOVID , 300/100,) 20 x 150 MG & 10 x 100MG  TBPK Take 3 tablets by mouth 2 (two) times daily for 5 days. Patient GFR is >90. Take nirmatrelvir (150  mg) two tablets twice daily for 5 days and ritonavir (100 mg) one tablet twice daily for 5 days. 07/21/24 07/26/24 Yes Shadasia Oldfield K, PA-C  promethazine -dextromethorphan (PROMETHAZINE -DM) 6.25-15 MG/5ML syrup Take 5 mLs by mouth 2 (two) times daily as needed for cough. 07/21/24  Yes Jaeline Whobrey K, PA-C  propranolol (INDERAL) 10 MG tablet TAKE 1 TABLET BY MOUTH DAILY AS NEEDED (ANXIETY). 04/30/24  Yes [provider]  tiZANidine (ZANAFLEX) 4 MG tablet Take 2-4 mg by mouth 2 (two) times daily as needed.   Yes [provider]  valACYclovir  (VALTREX ) 1000 MG tablet Take 1 tablet (1,000 mg total) by mouth 3 (three) times daily. 05/09/22  Yes Arvis Huxley B, PA-C  venlafaxine XR (EFFEXOR-XR) 37.5 MG 24 hr capsule Take by mouth. 05/24/24 07/21/24 Yes [provider]  WEGOVY 0.25 MG/0.5ML SOAJ SQ injection Inject 0.25 mg into the skin. 06/08/24  Yes [provider]  albuterol  (VENTOLIN  HFA) 108 (90 Base) MCG/ACT inhaler Inhale 2 puffs into the lungs every 4 (four) hours as needed. 10/30/22   Bernardino Ditch, NP  cetirizine (ZYRTEC) 10 MG tablet Take 10 mg by mouth daily. 03/19/21   [provider]  cyclobenzaprine (FLEXERIL) 10 MG tablet Take 10 mg by mouth at bedtime as needed. Patient not taking: Reported on 03/17/2024 12/23/19   [provider]  fluticasone (FLONASE) 50 MCG/ACT nasal spray Place 1 spray into both nostrils daily. 05/20/20   [provider]  Galcanezumab-gnlm 120 MG/ML SOAJ Inject into the skin. 12/29/21   [provider]  ipratropium (ATROVENT) 0.06 % nasal spray Place 2 sprays into both nostrils in the morning. 11/30/23   [provider]  lidocaine  (LIDODERM ) 5 % SMARTSIG:Patch(s) Topical Patient not taking: Reported on 03/17/2024 03/06/21   [provider]  mometasone-formoterol (DULERA) 100-5 MCG/ACT AERO INHALE 2 PUFFS AS DIRECTED TWICE A DAY Patient not taking: Reported on 03/17/2024 11/15/22   [provider]  mupirocin  nasal ointment (BACTROBAN ) 2 % Apply to wound 3 times a day. Patient not taking: Reported on 03/17/2024 09/20/20   Bernardino Ditch, NP  nystatin ointment (MYCOSTATIN) Apply topically. Patient not taking: Reported on 03/17/2024    [provider]  ondansetron  (ZOFRAN -ODT) 4 MG disintegrating tablet Take 1 tablet (4 mg total) by mouth every 8 (eight) hours as needed for nausea or vomiting. Patient not taking: Reported on 03/17/2024 08/18/22   Menshew, Candida LULLA Kings, PA-C  OZEMPIC, 0.25 OR 0.5 MG/DOSE, 2 MG/1.5ML SOPN Inject into the skin. Patient not taking: Reported on 03/17/2024 05/20/21   [provider]  phenazopyridine  (PYRIDIUM ) 200 MG tablet Take 1 tablet (200 mg total) by mouth 3 (three) times daily. 06/14/24   Bernardino Ditch, NP  polyethylene glycol powder (GLYCOLAX/MIRALAX) 17 GM/SCOOP powder Take by mouth.    [provider]  POTASSIUM PO Take by mouth.  [provider]  Spacer/Aero-Holding Chambers (AEROCHAMBER MV) inhaler Use as instructed 10/30/22   Bernardino Ditch, NP  spironolactone  (ALDACTONE ) 100 MG tablet Take 100 mg by mouth daily. 01/27/24   [provider]  amitriptyline (ELAVIL) 25 MG tablet Take 25 mg by mouth at bedtime as needed for sleep.  11/14/19  [provider]  lisinopril-hydrochlorothiazide (PRINZIDE,ZESTORETIC) 20-25 MG tablet Take 1 tablet by mouth daily.  02/02/21  [provider]    Family History Family History  Problem Relation Age of Onset   Hypertension Mother    Hypertension Sister    Hypertension Brother     Social History Social History   Tobacco Use   Smoking status: Never   Smokeless tobacco: Never  Vaping Use   Vaping status: Never Used  Substance Use Topics   Alcohol use: No    Alcohol/week: 0.0 standard drinks of alcohol   Drug use: No     Allergies   Iodinated contrast media, Levofloxacin , Oxybutynin, Penicillins, Other, Buprenorphine, Codeine, Sulfa  antibiotics, and  Tape   Review of Systems Review of Systems  Constitutional:  Positive for activity change, appetite change, fatigue and fever.  HENT:  Positive for congestion and sore throat. Negative for sinus pressure and sneezing.   Respiratory:  Positive for cough. Negative for shortness of breath.   Cardiovascular:  Negative for chest pain.  Gastrointestinal:  Negative for abdominal pain, diarrhea, nausea and vomiting.  Musculoskeletal:  Positive for arthralgias and myalgias.  Neurological:  Positive for headaches. Negative for dizziness and light-headedness.     Physical Exam Triage Vital Signs ED Triage Vitals  Encounter Vitals Group     BP 07/21/24 0940 (!) 140/100     Girls Systolic BP Percentile --      Girls Diastolic BP Percentile --      Boys Systolic BP Percentile --      Boys Diastolic BP Percentile --      Pulse Rate 07/21/24 0940 95     Resp 07/21/24 0940 14     Temp 07/21/24 0940 98.8 F (37.1 C)     Temp Source 07/21/24 0940 Oral     SpO2 07/21/24 0940 98 %     Weight 07/21/24 0933 167 lb 15.9 oz (76.2 kg)     Height 07/21/24 0933 4' 10 (1.473 m)     Head Circumference --      Peak Flow --      Pain Score 07/21/24 0933 9     Pain Loc --      Pain Education --      Exclude from Growth Chart --    No data found.  Updated Vital Signs BP (!) 135/94   Pulse 95   Temp 98.8 F (37.1 C) (Oral)   Resp 14   Ht 4' 10 (1.473 m)   Wt 167 lb 15.9 oz (76.2 kg)   LMP 10/25/2016   SpO2 98%   BMI 35.11 kg/m   Visual Acuity Right Eye Distance:   Left Eye Distance:   Bilateral Distance:    Right Eye Near:   Left Eye Near:    Bilateral Near:     Physical Exam Vitals reviewed.  Constitutional:      General: She is awake. She is not in acute distress.    Appearance: Normal appearance. She is well-developed. She is not ill-appearing.     Comments: Very pleasant female appears stated age in no acute distress sitting comfortably in exam room  HENT:  Head:  Normocephalic and atraumatic.     Right Ear: Tympanic membrane, ear canal and external ear normal. Tympanic membrane is not erythematous or bulging.     Left Ear: Tympanic membrane, ear canal and external ear normal. Tympanic membrane is not erythematous or bulging.     Nose:     Right Sinus: No maxillary sinus tenderness or frontal sinus tenderness.     Left Sinus: No maxillary sinus tenderness or frontal sinus tenderness.     Mouth/Throat:     Pharynx: Uvula midline. Posterior oropharyngeal erythema and postnasal drip present. No oropharyngeal exudate.  Cardiovascular:     Rate and Rhythm: Normal rate and regular rhythm.     Heart sounds: Normal heart sounds, S1 normal and S2 normal. No murmur heard. Pulmonary:     Effort: Pulmonary effort is normal.     Breath sounds: Normal breath sounds. No wheezing, rhonchi or rales.     Comments: Clear to auscultation bilaterally Psychiatric:        Behavior: Behavior is cooperative.      UC Treatments / Results  Labs (all labs ordered are listed, but only abnormal results are displayed) Labs Reviewed  RESP PANEL BY RT-PCR (FLU A&B, COVID) ARPGX2 - Abnormal; Notable for the following components:      Result Value   SARS Coronavirus 2 by RT PCR POSITIVE (*)    All other components within normal limits  GROUP A STREP BY PCR    EKG   Radiology No results found.  Procedures Procedures (including critical care time)  Medications Ordered in UC Medications - No data to display  Initial Impression / Assessment and Plan / UC Course  I have reviewed the triage vital signs and the nursing notes.  Pertinent labs & imaging results that were available during my care of the patient were reviewed by me and considered in my medical decision making (see chart for details).     Patient is well-appearing, afebrile, nontoxic, nontachycardic.  No evidence of acute infection on physical exam that warrant initiation of antibiotics.  Chest x-ray was  deferred as she did not have additional sounds on exam and her oxygen saturation was 98%.  We discussed likely viral etiology and so she was tested for COVID, flu, strep and was found to be positive for COVID.  Given her history of asthma she is a candidate for antiviral therapy and was interested in initiating this.  She had a metabolic panel obtained 06/17/2024 in [facility] with creatinine of 0.66 and eGFR greater than 90 mL/min so she does not require dose adjustment of this medication.  She will need to hold her hydroxyzine and cyclobenzaprine while on this medication for 3 days after completing course.  Recommended that she use over-the-counter medication including Mucinex, Flonase, Tylenol .  She was given Promethazine  DM for cough and we discussed that this can be sedating and she is not to drive or drink alcohol taking it.  Recommended that she obtain a pulse oximeter and monitor her oxygen saturation and be evaluated if below 93% consistently and go to the emergency room if below 90% consistently.  We discussed that if she is not feeling better within 3 to 5 days please return for reevaluation.  If anything worsens she needs to be seen immediately.  Strict return precautions given.  Excuse note provided.  Final Clinical Impressions(s) / UC Diagnoses   Final diagnoses:  COVID-19  Body aches     Discharge Instructions  You tested positive for COVID.  Because of your history of asthma, we are going to start Paxlovid .  Take this twice a day for 5 days.  This can cause an upset stomach and abnormal taste in your mouth.  While you are on this medication please do not take your hydroxyzine or cyclobenzaprine for the 5 days you are taking this medicine and for 3 days after (a total of 8 days).  Use over-the-counter medicine such as Mucinex, Flonase, Tylenol .  I have called in Promethazine  DM for cough.  This will make you sleepy so do not drive or drink alcohol with taking it.  If you are not  feeling better within 3 to 5 days please return for reevaluation.  If anything worsens and you have high fever, worsening cough, shortness of breath, chest pain, nausea/vomiting interfere with oral intake, weakness you need to be seen immediately.  I do recommend you obtain a pulse oximeter and monitor your oxygen saturation.  If this drops below 93% consistently please return here and if below 90% go to the emergency room.     ED Prescriptions     Medication Sig Dispense Auth. Provider   nirmatrelvir/ritonavir (PAXLOVID , 300/100,) 20 x 150 MG & 10 x 100MG  TBPK Take 3 tablets by mouth 2 (two) times daily for 5 days. Patient GFR is >90. Take nirmatrelvir (150 mg) two tablets twice daily for 5 days and ritonavir (100 mg) one tablet twice daily for 5 days. 30 tablet Laramie Gelles K, PA-C   promethazine -dextromethorphan (PROMETHAZINE -DM) 6.25-15 MG/5ML syrup Take 5 mLs by mouth 2 (two) times daily as needed for cough. 50 mL Chrissie Dacquisto K, PA-C      PDMP not reviewed this encounter.   Sherrell Rocky POUR, PA-C 07/21/24 1046

## 2024-07-25 ENCOUNTER — Ambulatory Visit
Admission: EM | Admit: 2024-07-25 | Discharge: 2024-07-25 | Disposition: A | Discharge status: Home / Self Care | Attending: Family Medicine | Admitting: Family Medicine

## 2024-07-25 DIAGNOSIS — R112 Nausea with vomiting, unspecified: Secondary | ICD-10-CM

## 2024-07-25 DIAGNOSIS — T50905A Adverse effect of unspecified drugs, medicaments and biological substances, initial encounter: Secondary | ICD-10-CM | POA: Insufficient documentation

## 2024-07-25 DIAGNOSIS — R109 Unspecified abdominal pain: Secondary | ICD-10-CM

## 2024-07-25 DIAGNOSIS — T375X5A Adverse effect of antiviral drugs, initial encounter: Secondary | ICD-10-CM

## 2024-07-25 DIAGNOSIS — R21 Rash and other nonspecific skin eruption: Secondary | ICD-10-CM | POA: Insufficient documentation

## 2024-07-25 DIAGNOSIS — R519 Headache, unspecified: Secondary | ICD-10-CM | POA: Diagnosis not present

## 2024-07-25 DIAGNOSIS — U071 COVID-19: Secondary | ICD-10-CM

## 2024-07-25 LAB — CBC WITH DIFFERENTIAL/PLATELET
Abs Immature Granulocytes: 0.02 K/uL (ref 0.00–0.07)
Basophils Absolute: 0.1 K/uL (ref 0.0–0.1)
Basophils Relative: 1 %
Eosinophils Absolute: 0.1 K/uL (ref 0.0–0.5)
Eosinophils Relative: 1 %
HCT: 52.5 % — ABNORMAL HIGH (ref 36.0–46.0)
Hemoglobin: 17.5 g/dL — ABNORMAL HIGH (ref 12.0–15.0)
Immature Granulocytes: 0 %
Lymphocytes Relative: 32 %
Lymphs Abs: 2.8 K/uL (ref 0.7–4.0)
MCH: 28.2 pg (ref 26.0–34.0)
MCHC: 33.3 g/dL (ref 30.0–36.0)
MCV: 84.5 fL (ref 80.0–100.0)
Monocytes Absolute: 0.7 K/uL (ref 0.1–1.0)
Monocytes Relative: 8 %
Neutro Abs: 5 K/uL (ref 1.7–7.7)
Neutrophils Relative %: 58 %
Platelets: 252 K/uL (ref 150–400)
RBC: 6.21 MIL/uL — ABNORMAL HIGH (ref 3.87–5.11)
RDW: 13.8 % (ref 11.5–15.5)
WBC: 8.6 K/uL (ref 4.0–10.5)
nRBC: 0 % (ref 0.0–0.2)

## 2024-07-25 LAB — COMPREHENSIVE METABOLIC PANEL WITH GFR
ALT: 10 U/L (ref 0–44)
AST: 12 U/L — ABNORMAL LOW (ref 15–41)
Albumin: 4.2 g/dL (ref 3.5–5.0)
Alkaline Phosphatase: 59 U/L (ref 38–126)
Anion gap: 13 (ref 5–15)
BUN: 24 mg/dL — ABNORMAL HIGH (ref 6–20)
CO2: 23 mmol/L (ref 22–32)
Calcium: 9.5 mg/dL (ref 8.9–10.3)
Chloride: 103 mmol/L (ref 98–111)
Creatinine, Ser: 0.48 mg/dL (ref 0.44–1.00)
GFR, Estimated: >60 mL/min (ref >60)
Glucose, Bld: 83 mg/dL (ref 70–99)
Potassium: 4 mmol/L (ref 3.5–5.1)
Sodium: 139 mmol/L (ref 135–145)
Total Bilirubin: 0.9 mg/dL (ref 0.0–1.2)
Total Protein: 7.7 g/dL (ref 6.5–8.1)

## 2024-07-25 MED ORDER — ONDANSETRON 4 MG PO TBDP: 4.0000 mg | ORAL_TABLET | Freq: Three times a day (TID) | ORAL | 0 refills | Status: AC | PRN

## 2024-07-25 MED ORDER — DEXAMETHASONE SODIUM PHOSPHATE 10 MG/ML IJ SOLN
10.0000 mg | Freq: Once | INTRAMUSCULAR | Status: AC
  Administered 2024-07-25: 10 mg via INTRAMUSCULAR

## 2024-07-25 MED ORDER — ONDANSETRON HCL 4 MG/2ML IJ SOLN
8.0000 mg | Freq: Once | INTRAMUSCULAR | Status: AC
  Administered 2024-07-25: 8 mg via INTRAMUSCULAR

## 2024-07-25 NOTE — ED Triage Notes (Signed)
 Pt c/o abd pain & blister since taking paxlovid . Also c/o emesis x1 day. Had 2 episodes of emesis today. Unable to keep any foods or fluids down. Was seen here on 9/20 & tested + for covid.

## 2024-07-25 NOTE — Discharge Instructions (Addendum)
 You were seen today for a reaction to the paxlovid .  Your lab work overall looks okay today.  I have given you several injections to help with the headaches, itchiness, and nausea.  I have sent more zofran  to your pharmacy to help with nausea and vomiting.  Please drink small amounts of clear liquids (gatorade, sprite, 7-up) to help stay hydrated.  You may use claritin or benadryl  to help with any itch you may have.  If you are unable to keep food/drink down, and are feeling worse, please return or go to the nearest Emergency Room.

## 2024-07-25 NOTE — ED Provider Notes (Signed)
 [facility]    CSN: [id number] Arrival date & time: 07/25/24  1029      History   Chief Complaint Chief Complaint  Patient presents with   Covid Positive   Medication Reaction    HPI Tiffany Andrade is a 48 y.o. female.   Patient is here for not feeling well.  Was seen on 9/20 for 2 days of URI symptoms.  Tested positive for covid.  She was given paxlovid  to treat, and took that x 3 days.  She started with side affects to the paxlovid  yesterday.  Having stomach pain, n/v, feeling itchy, blisters on her arms.  She last ate/drank yesterday.  Had crackers and corn, but came back up.  She did try crackers with 7-up this morning, but all came back up.        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/22/2020   Other chronic pain 02/15/2020   Chest pain 05/25/2018   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   Right leg pain 04/14/2018   Migraine without aura and without status migrainosus, not intractable 06/03/2017   Chronic cystitis 04/06/2017   Hematuria, microscopic 04/06/2017   Incomplete emptying of bladder 04/06/2017   Ureteral mass 04/06/2017   Unilateral ureteral reflux 04/06/2017   Right arm pain 11/12/2013   Anemia 09/25/2013   Constipation 09/25/2013   Dysmenorrhea 09/25/2013   Menorrhagia 09/25/2013   Hematochezia 05/08/2013   Obstructive sleep apnea 12/26/2012   Essential (primary) hypertension 10/19/2012   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      Home Medications    Prior to Admission medications   Medication Sig Start Date End Date Taking? Authorizing Provider  albuterol  (VENTOLIN  HFA) 108 (90 Base) MCG/ACT inhaler Inhale 2 puffs into the lungs every 4 (four) hours as needed. 10/30/22   Bernardino Ditch, NP  B Complex-C-Calcium  (GNP B-COMPLEX PLUS VITAMIN C) TABS Take by mouth.    [provider]  beclomethasone (QVAR REDIHALER) 40 MCG/ACT inhaler Inhale into the lungs. 01/18/21   [provider]  candesartan (ATACAND) 32 MG tablet Take 32 mg by mouth daily. 12/26/20   [provider]  cetirizine (ZYRTEC) 10 MG tablet Take 10 mg by mouth daily. 03/19/21   [provider]  chlorthalidone (HYGROTON) 25 MG tablet Take 25 mg by mouth daily.    [provider]  cyclobenzaprine (FLEXERIL) 10 MG tablet Take 10 mg by mouth at bedtime as needed. Patient not taking: Reported on 03/17/2024 12/23/19   [provider]  fluticasone (FLONASE) 50 MCG/ACT nasal spray Place 1 spray  into both nostrils daily. 05/20/20   [provider]  Galcanezumab-gnlm 120 MG/ML SOAJ Inject into the skin. 12/29/21   [provider]  hydrOXYzine (ATARAX) 25 MG tablet Take 25-50 mg by mouth 3 (three) times daily as needed.    [provider]  ipratropium (ATROVENT) 0.06 % nasal spray Place 2 sprays into both nostrils in the morning. 11/30/23   [provider]  lidocaine  (LIDODERM ) 5 % SMARTSIG:Patch(s) Topical Patient not taking: Reported on 03/17/2024 03/06/21   [provider]  MAGNESIUM PO Take by mouth.    [provider]  meloxicam  (MOBIC ) 15 MG tablet Take by  mouth. 09/14/19   [provider]  methocarbamol (ROBAXIN) 500 MG tablet Take 500 mg by mouth 2 (two) times daily as needed. 01/31/22   [provider]  mometasone-formoterol (DULERA) 100-5 MCG/ACT AERO INHALE 2 PUFFS AS DIRECTED TWICE A DAY Patient not taking: Reported on 03/17/2024 11/15/22   [provider]  mupirocin  nasal ointment (BACTROBAN ) 2 % Apply to wound 3 times a day. Patient not taking: Reported on 03/17/2024 09/20/20   Bernardino Ditch, NP  nirmatrelvir/ritonavir (PAXLOVID , 300/100,) 20 x 150 MG & 10 x 100MG  TBPK Take 3 tablets by mouth 2 (two) times daily for 5 days. Patient GFR is >90. Take nirmatrelvir (150 mg) two tablets twice daily for 5 days and ritonavir (100 mg) one tablet twice daily for 5 days. 07/21/24 07/26/24  Raspet, Janaya Broy K, PA-C  nystatin ointment (MYCOSTATIN) Apply topically. Patient not taking: Reported on 03/17/2024    [provider]  ondansetron  (ZOFRAN -ODT) 4 MG disintegrating tablet Take 1 tablet (4 mg total) by mouth every 8 (eight) hours as needed for nausea or vomiting. Patient not taking: Reported on 03/17/2024 08/18/22   Menshew, Candida LULLA Kings, PA-C  OZEMPIC, 0.25 OR 0.5 MG/DOSE, 2 MG/1.5ML SOPN Inject into the skin. Patient not taking: Reported on 03/17/2024 05/20/21   [provider]  phenazopyridine  (PYRIDIUM ) 200 MG tablet Take 1 tablet (200 mg total) by mouth 3 (three) times daily. 06/14/24   Bernardino Ditch, NP  polyethylene glycol powder (GLYCOLAX/MIRALAX) 17 GM/SCOOP powder Take by mouth.    [provider]  POTASSIUM PO Take by mouth.    [provider]  promethazine -dextromethorphan (PROMETHAZINE -DM) 6.25-15 MG/5ML syrup Take 5 mLs by mouth 2 (two) times daily as needed for cough. 07/21/24   Raspet, Kvon Mcilhenny K, PA-C  propranolol (INDERAL) 10 MG tablet TAKE 1 TABLET BY MOUTH DAILY AS NEEDED (ANXIETY). 04/30/24   [provider]  Spacer/Aero-Holding Chambers (AEROCHAMBER MV) inhaler Use as instructed  10/30/22   Bernardino Ditch, NP  spironolactone  (ALDACTONE ) 100 MG tablet Take 100 mg by mouth daily. 01/27/24   [provider]  tiZANidine (ZANAFLEX) 4 MG tablet Take 2-4 mg by mouth 2 (two) times daily as needed.    [provider]  valACYclovir  (VALTREX ) 1000 MG tablet Take 1 tablet (1,000 mg total) by mouth 3 (three) times daily. 05/09/22   Arvis Jolan NOVAK, PA-C  venlafaxine XR (EFFEXOR-XR) 37.5 MG 24 hr capsule Take by mouth. 05/24/24 07/21/24  [provider]  WEGOVY 0.25 MG/0.5ML SOAJ SQ injection Inject 0.25 mg into the skin. 06/08/24   [provider]  amitriptyline (ELAVIL) 25 MG tablet Take 25 mg by mouth at bedtime as needed for sleep.  11/14/19  [provider]  lisinopril-hydrochlorothiazide (PRINZIDE,ZESTORETIC) 20-25 MG tablet Take 1 tablet by mouth daily.  02/02/21  [provider]    Family History Family History  Problem Relation Age of Onset   Hypertension Mother    Hypertension Sister    Hypertension Brother     Social History Social History   Tobacco Use   Smoking status: Never   Smokeless tobacco: Never  Vaping Use   Vaping status: Never Used  Substance Use Topics   Alcohol use: No    Alcohol/week: 0.0 standard drinks of alcohol   Drug use: No     Allergies   Iodinated contrast media, Levofloxacin , Oxybutynin, Penicillins, Other, Buprenorphine, Codeine, Sulfa  antibiotics, and Tape   Review of Systems Review of Systems  Constitutional:  Positive for fatigue.  Gastrointestinal:  Positive for abdominal pain, nausea and vomiting.  Genitourinary: Negative.   Musculoskeletal: Negative.   Skin:  Positive for rash.  Neurological:  Positive for headaches.  Psychiatric/Behavioral: Negative.       Physical Exam Triage Vital Signs ED Triage Vitals  Encounter Vitals Group     BP 07/25/24 1051 (!) 143/100     Girls Systolic BP Percentile --      Girls Diastolic BP Percentile --      Boys Systolic BP Percentile  --      Boys Diastolic BP Percentile --      Pulse Rate 07/25/24 1051 68     Resp 07/25/24 1051 16     Temp 07/25/24 1051 97.9 F (36.6 C)     Temp Source 07/25/24 1051 Oral     SpO2 07/25/24 1051 98 %     Weight 07/25/24 1050 167 lb 15.9 oz (76.2 kg)     Height 07/25/24 1050 4' 10 (1.473 m)     Head Circumference --      Peak Flow --      Pain Score 07/25/24 1053 8     Pain Loc --      Pain Education --      Exclude from Growth Chart --    No data found.  Updated Vital Signs BP (!) 143/100 (BP Location: Left Arm)   Pulse 68   Temp 97.9 F (36.6 C) (Oral)   Resp 16   Ht 4' 10 (1.473 m)   Wt 76.2 kg   LMP 10/25/2016   SpO2 98%   BMI 35.11 kg/m   Visual Acuity Right Eye Distance:   Left Eye Distance:   Bilateral Distance:    Right Eye Near:   Left Eye Near:    Bilateral Near:     Physical Exam Constitutional:      General: She is not in acute distress.    Appearance: Normal appearance. She is normal weight. She is not ill-appearing or toxic-appearing.  HENT:     Mouth/Throat:     Mouth: Mucous membranes are moist.     Pharynx: No oropharyngeal exudate or posterior oropharyngeal erythema.  Cardiovascular:     Rate and Rhythm: Normal rate and regular rhythm.  Pulmonary:     Effort: Pulmonary effort is normal.     Breath sounds: Normal breath sounds.  Abdominal:     Palpations: Abdomen is soft.     Tenderness: There is abdominal tenderness in the epigastric area.     Comments: She has generalized tenderness across the upper abdomen, more to the epigastric area  Musculoskeletal:     Cervical back: Normal range of motion and neck supple.  Lymphadenopathy:     Cervical: No cervical adenopathy.  Skin:    Comments: Small opened lesion to right upper abdomen;  several on her right upper  arm;  another to her left neck  Neurological:     General: No focal deficit present.     Mental Status: She is alert.  Psychiatric:        Mood and Affect: Mood normal.       UC Treatments / Results  Labs (all labs ordered are listed, but only abnormal results are displayed) Labs Reviewed  COMPREHENSIVE METABOLIC PANEL WITH GFR - Abnormal; Notable for the following components:      Result Value   BUN 24 (*)    AST 12 (*)    All other components within normal limits  CBC WITH DIFFERENTIAL/PLATELET - Abnormal; Notable for the following components:   RBC 6.21 (*)    Hemoglobin 17.5 (*)    HCT 52.5 (*)    All other components within normal limits    EKG   Radiology No results found.  Procedures Procedures (including critical care time)  Medications Ordered in UC Medications  ondansetron  (ZOFRAN ) injection 8 mg (8 mg Intramuscular Given 07/25/24 1124)  dexamethasone  (DECADRON ) injection 10 mg (10 mg Intramuscular Given 07/25/24 1126)    Initial Impression / Assessment and Plan / UC Course  I have reviewed the triage vital signs and the nursing notes.  Pertinent labs & imaging results that were available during my care of the patient were reviewed by me and considered in my medical decision making (see chart for details).  Patient is here for n/v, rash and headache from paxlovid .  Lab work appears okay.  She was given IM zofran  and dexamethasone .  She was able to tolerate oral fluids while here, and stable for d/c.   Final Clinical Impressions(s) / UC Diagnoses   Final diagnoses:  Adverse effect of drug, initial encounter  Nausea and vomiting, unspecified vomiting type  Abdominal pain, unspecified abdominal location  Acute nonintractable headache, unspecified headache type  COVID-19 virus infection     Discharge Instructions      You were seen today for a reaction to the paxlovid .  Your lab work overall looks okay today.  I have given you several injections to help with the headaches, itchiness, and nausea.  I have sent more zofran  to your pharmacy to help with nausea and vomiting.  Please drink small amounts of clear liquids  (gatorade, sprite, 7-up) to help stay hydrated.  You may use claritin or benadryl  to help with any itch you may have.  If you are unable to keep food/drink down, and are feeling worse, please return or go to the nearest Emergency Room.     ED Prescriptions     Medication Sig Dispense Auth. Provider   ondansetron  (ZOFRAN -ODT) 4 MG disintegrating tablet Take 1 tablet (4 mg total) by mouth every 8 (eight) hours as needed for nausea or vomiting. 20 tablet Darral Longs, MD      PDMP not reviewed this encounter.   Darral Longs, MD 07/25/24 [id number]

## 2024-10-03 ENCOUNTER — Ambulatory Visit (INDEPENDENT_AMBULATORY_CARE_PROVIDER_SITE_OTHER)

## 2024-10-03 ENCOUNTER — Ambulatory Visit: Admission: EM | Admit: 2024-10-03 | Discharge: 2024-10-03 | Disposition: A | Discharge status: Home / Self Care

## 2024-10-03 DIAGNOSIS — M4316 Spondylolisthesis, lumbar region: Secondary | ICD-10-CM | POA: Diagnosis not present

## 2024-10-03 DIAGNOSIS — M5441 Lumbago with sciatica, right side: Secondary | ICD-10-CM | POA: Diagnosis not present

## 2024-10-03 NOTE — ED Triage Notes (Signed)
 Patient states that she's been having right hp pain x 2 weeks. Patient states that today while she was at work she felt something pop and is now having pain in her back and down to her knee.

## 2024-10-03 NOTE — ED Provider Notes (Signed)
 [facility]    CSN: [id number] Arrival date & time: 10/03/24  1531      History   Chief Complaint Chief Complaint  Patient presents with   Hip Pain   Back Pain    HPI Tiffany Andrade is a 48 y.o. female.   48 year old female, Tiffany Andrade, presents to urgent care for evaluation of right hip pain for 2 weeks.  Patient states that while she was at work today she felt some pop is now having pain in her back down her knee.  She denies saddle numbness , loss of bowel and bladder , loss of function , pt has a pain provider that scripts Zanaflex for NF. Pt states gabapentin does not work and can't take lyrica.  The history is provided by the patient. No language interpreter was used.    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   Left homonymous hemianopsia 09/22/2020   Other chronic pain 02/15/2020   Chest pain 05/25/2018   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   Right leg pain 04/14/2018   Migraine without aura and without status migrainosus, not intractable 06/03/2017   Chronic cystitis 04/06/2017   Hematuria, microscopic 04/06/2017   Incomplete emptying of bladder 04/06/2017   Ureteral mass 04/06/2017   Unilateral ureteral reflux 04/06/2017   Right arm pain 11/12/2013   Anemia 09/25/2013   Constipation 09/25/2013   Dysmenorrhea 09/25/2013   Menorrhagia 09/25/2013    Hematochezia 05/08/2013   Obstructive sleep apnea 12/26/2012   Essential (primary) hypertension 10/19/2012   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      Home Medications    Prior to Admission medications   Medication Sig Start Date End Date Taking? Authorizing Provider  candesartan (ATACAND) 32 MG tablet Take 32 mg by mouth daily. 12/26/20  Yes [provider]  lamoTRIgine (LAMICTAL) 25 MG tablet TAKE 1 TAB EVERY MORNING FOR MOOD 03/22/24  Yes [provider]  LINZESS 72 MCG capsule Take by mouth.   Yes [provider]  propranolol (INDERAL) 10 MG tablet TAKE 1 TABLET BY MOUTH DAILY AS NEEDED (ANXIETY). 04/30/24  Yes [provider]  spironolactone  (ALDACTONE ) 100 MG tablet Take 100 mg by mouth daily. 01/27/24  Yes [provider]  venlafaxine XR (EFFEXOR-XR) 37.5 MG 24 hr capsule Take by mouth. 05/24/24 10/03/24 Yes [provider]  WEGOVY 0.25 MG/0.5ML SOAJ SQ injection Inject 0.25 mg into the skin. 06/08/24  Yes [provider]  albuterol  (VENTOLIN  HFA) 108 (90 Base)  MCG/ACT inhaler Inhale 2 puffs into the lungs every 4 (four) hours as needed. 10/30/22   Bernardino Ditch, NP  B Complex-C-Calcium  (GNP B-COMPLEX PLUS VITAMIN C) TABS Take by mouth.    [provider]  beclomethasone (QVAR REDIHALER) 40 MCG/ACT inhaler Inhale into the lungs. 01/18/21   [provider]  cetirizine (ZYRTEC) 10 MG tablet Take 10 mg by mouth daily. 03/19/21   [provider]  chlorthalidone (HYGROTON) 25 MG tablet Take 25 mg by mouth daily.    [provider]  cyclobenzaprine (FLEXERIL) 10 MG tablet Take 10 mg by mouth at bedtime as  needed. Patient not taking: Reported on 03/17/2024 12/23/19   [provider]  fluticasone (FLONASE) 50 MCG/ACT nasal spray Place 1 spray into both nostrils daily. 05/20/20   [provider]  Galcanezumab-gnlm 120 MG/ML SOAJ Inject into the skin. 12/29/21   [provider]  hydrOXYzine (ATARAX) 25 MG tablet Take 25-50 mg by mouth 3 (three) times daily as needed.    [provider]  ipratropium (ATROVENT) 0.06 % nasal spray Place 2 sprays into both nostrils in the morning. 11/30/23   [provider]  lidocaine  (LIDODERM ) 5 % SMARTSIG:Patch(s) Topical Patient not taking: Reported on 03/17/2024 03/06/21   [provider]  MAGNESIUM PO Take by mouth.    [provider]  meloxicam  (MOBIC ) 15 MG tablet Take by mouth. 09/14/19   [provider]  methocarbamol (ROBAXIN) 500 MG tablet Take 500 mg by mouth 2 (two) times daily as needed. 01/31/22   [provider]  mometasone-formoterol (DULERA) 100-5 MCG/ACT AERO INHALE 2 PUFFS AS DIRECTED TWICE A DAY Patient not taking: Reported on 03/17/2024 11/15/22   [provider]  mupirocin  nasal ointment (BACTROBAN ) 2 % Apply to wound 3 times a day. Patient not taking: Reported on 03/17/2024 09/20/20   Bernardino Ditch, NP  nystatin ointment (MYCOSTATIN) Apply topically. Patient not taking: Reported on 03/17/2024    [provider]  ondansetron  (ZOFRAN -ODT) 4 MG disintegrating tablet Take 1 tablet (4 mg total) by mouth every 8 (eight) hours as needed for nausea or vomiting. 07/25/24   Piontek, Erin, MD  OZEMPIC, 0.25 OR 0.5 MG/DOSE, 2 MG/1.5ML SOPN Inject into the skin. Patient not taking: Reported on 03/17/2024 05/20/21   [provider]  phenazopyridine  (PYRIDIUM ) 200 MG tablet Take 1 tablet (200 mg total) by mouth 3 (three) times daily. 06/14/24   Bernardino Ditch, NP  polyethylene glycol powder (GLYCOLAX/MIRALAX) 17 GM/SCOOP powder Take by mouth.    [provider]   POTASSIUM PO Take by mouth.    [provider]  promethazine -dextromethorphan (PROMETHAZINE -DM) 6.25-15 MG/5ML syrup Take 5 mLs by mouth 2 (two) times daily as needed for cough. 07/21/24   Raspet, Rocky POUR, PA-C  Spacer/Aero-Holding Chambers (AEROCHAMBER MV) inhaler Use as instructed 10/30/22   Bernardino Ditch, NP  tiZANidine (ZANAFLEX) 4 MG tablet Take 2-4 mg by mouth 2 (two) times daily as needed.    [provider]  valACYclovir  (VALTREX ) 1000 MG tablet Take 1 tablet (1,000 mg total) by mouth 3 (three) times daily. 05/09/22   Arvis Jolan NOVAK, PA-C  amitriptyline (ELAVIL) 25 MG tablet Take 25 mg by mouth at bedtime as needed for sleep.  11/14/19  [provider]  lisinopril-hydrochlorothiazide (PRINZIDE,ZESTORETIC) 20-25 MG tablet Take 1 tablet by mouth daily.  02/02/21  [provider]    Family History Family History  Problem Relation Age of Onset   Hypertension Mother    Hypertension Sister  Hypertension Brother     Social History Social History   Tobacco Use   Smoking status: Never   Smokeless tobacco: Never  Vaping Use   Vaping status: Never Used  Substance Use Topics   Alcohol use: No    Alcohol/week: 0.0 standard drinks of alcohol   Drug use: No     Allergies   Iodinated contrast media, Levofloxacin , Oxybutynin, Paxlovid  [nirmatrelvir-ritonavir], Penicillins, Other, Buprenorphine, Codeine, Sulfa  antibiotics, and Tape   Review of Systems Review of Systems  Constitutional:  Negative for fever.  Gastrointestinal:  Negative for abdominal pain, nausea and vomiting.  Genitourinary:  Negative for dysuria.  Musculoskeletal:  Positive for back pain and myalgias.  All other systems reviewed and are negative.    Physical Exam Triage Vital Signs ED Triage Vitals  Encounter Vitals Group     BP 10/03/24 1607 (!) 154/91     Girls Systolic BP Percentile --      Girls Diastolic BP Percentile --      Boys Systolic BP Percentile --      Boys  Diastolic BP Percentile --      Pulse Rate 10/03/24 1607 61     Resp 10/03/24 1607 18     Temp 10/03/24 1607 97.8 F (36.6 C)     Temp Source 10/03/24 1607 Oral     SpO2 10/03/24 1607 97 %     Weight 10/03/24 1605 168 lb (76.2 kg)     Height --      Head Circumference --      Peak Flow --      Pain Score 10/03/24 1604 10     Pain Loc --      Pain Education --      Exclude from Growth Chart --    No data found.  Updated Vital Signs BP (!) 154/91 (BP Location: Right Arm)   Pulse 61   Temp 97.8 F (36.6 C) (Oral)   Resp 18   Wt 168 lb (76.2 kg)   LMP 10/25/2016   SpO2 97%   BMI 35.11 kg/m   Visual Acuity Right Eye Distance:   Left Eye Distance:   Bilateral Distance:    Right Eye Near:   Left Eye Near:    Bilateral Near:     Physical Exam Vitals and nursing note reviewed.  Constitutional:      Appearance: She is well-developed and well-groomed.  Cardiovascular:     Rate and Rhythm: Normal rate and regular rhythm.     Pulses:          Dorsalis pedis pulses are 2+ on the right side and 2+ on the left side.     Heart sounds: Normal heart sounds.  Pulmonary:     Effort: Pulmonary effort is normal.  Musculoskeletal:     Lumbar back: Spasms and tenderness present. No swelling, deformity, signs of trauma, lacerations or bony tenderness. Normal range of motion. Positive right straight leg raise test. Negative left straight leg raise test. No scoliosis.  Neurological:     General: No focal deficit present.     Mental Status: She is alert and oriented to person, place, and time.     GCS: GCS eye subscore is 4. GCS verbal subscore is 5. GCS motor subscore is 6.     Comments: =dorsiflex/ext bilaterally  Psychiatric:        Attention and Perception: Attention normal.        Mood and Affect: Mood normal.  Speech: Speech normal.        Behavior: Behavior normal. Behavior is cooperative.      UC Treatments / Results  Labs (all labs ordered are listed, but only  abnormal results are displayed) Labs Reviewed - No data to display  EKG   Radiology DG Lumbar Spine Complete Result Date: 10/03/2024 CLINICAL DATA:  Low back pain. Acute bilateral low back pain with right-sided sciatica. EXAM: LUMBAR SPINE - COMPLETE 4+ VIEW COMPARISON:  None Available. FINDINGS: Five non-rib-bearing lumbar vertebra with diminutive ribs at T12. Trace anterolisthesis L4 on L5. Otherwise normal alignment. Vertebral body heights are normal. No fracture or compression deformity. Anterior spurring at multiple levels, disc spaces are preserved. Mild L4-L5 facet hypertrophy. No visible pars defects or focal bone abnormality. The sacroiliac joints are congruent. IMPRESSION: 1. Mild L4-L5 facet hypertrophy with trace anterolisthesis. 2. Anterior spurring at multiple levels. Electronically Signed   By: Andrea Gasman M.D.   On: 10/03/2024 17:16    Procedures Procedures (including critical care time)  Medications Ordered in UC Medications - No data to display  Initial Impression / Assessment and Plan / UC Course  I have reviewed the triage vital signs and the nursing notes.  Pertinent labs & imaging results that were available during my care of the patient were reviewed by me and considered in my medical decision making (see chart for details).  Clinical Course as of 10/03/24 1733  Wed Oct 03, 2024  1724 Xray IMPRESSION: 1. Mild L4-L5 facet hypertrophy with trace anterolisthesis. 2. Anterior spurring at multiple levels.  [JD]    Clinical Course User Index [JD] Naiomi Musto, Rilla, NP  Discussed exam findings and plan of care with patient , will not prescribe NSAIDs as patient is on meloxicam  but has history of hematochezia,GERD,reflux.  Patient already has Zanaflex for muscle spasms, advised rest,ice may use Biofreeze/ lidocaine  patch as label directed for pain, work note given.  If you develop saddle numbness, loss of function, worsening pain, loss of bowel and bladder go to  the emergency room for further evaluation.  Patient verbalized understanding to this provider, work note given  Ddx: Acute low back pain with  right sided sciatica, anterolisthesis of lumbar spine, muscle spasm.  Final Clinical Impressions(s) / UC Diagnoses   Final diagnoses:  Acute bilateral low back pain with right-sided sciatica  Anterolisthesis of lumbar spine     Discharge Instructions      Your lumbar xray was negative for fracture or dislocation.  Take your zanaflex as prescribed Pt advised otc tylenol  as label directed for pain Follow up with pain provider-call for appt Take home meds as directed.  May use heat or ice to back for comfort 20 min 3 x daily.  May use lidocaine  patch or biofreeze for pain.  Please follow up with PCP, may need to referral to physical therapy for further back pain management.  GO immediately to nearest ER or call 9-1-1 for loss of bowel and bladder,loss of function, saddle numbness, etc.   Avoid lifting,turning,bending as this will aggravate your back     ED Prescriptions   None    PDMP not reviewed this encounter.   Aminta Rilla, NP 10/03/24 1906

## 2024-10-03 NOTE — Discharge Instructions (Addendum)
 Your lumbar xray was negative for fracture or dislocation.  Take your zanaflex as prescribed Pt advised otc tylenol  as label directed for pain Follow up with pain provider-call for appt Take home meds as directed.  May use heat or ice to back for comfort 20 min 3 x daily.  May use lidocaine  patch or biofreeze for pain.  Please follow up with PCP, may need to referral to physical therapy for further back pain management.  GO immediately to nearest ER or call 9-1-1 for loss of bowel and bladder,loss of function, saddle numbness, etc.   Avoid lifting,turning,bending as this will aggravate your back
